# Patient Record
Sex: Female | Born: 1995 | Hispanic: No | Marital: Single | Smoking: Never smoker
Health system: Southern US, Community
[De-identification: ages and names within clinical notes are randomized; demographics above are authoritative.]

## PROBLEM LIST (undated history)

## (undated) DIAGNOSIS — J45909 Unspecified asthma, uncomplicated: Secondary | ICD-10-CM

## (undated) DIAGNOSIS — F32A Depression, unspecified: Secondary | ICD-10-CM

## (undated) DIAGNOSIS — E282 Polycystic ovarian syndrome: Secondary | ICD-10-CM

## (undated) DIAGNOSIS — R0602 Shortness of breath: Secondary | ICD-10-CM

## (undated) DIAGNOSIS — T7840XA Allergy, unspecified, initial encounter: Secondary | ICD-10-CM

## (undated) DIAGNOSIS — L729 Follicular cyst of the skin and subcutaneous tissue, unspecified: Secondary | ICD-10-CM

## (undated) DIAGNOSIS — E669 Obesity, unspecified: Secondary | ICD-10-CM

## (undated) DIAGNOSIS — D649 Anemia, unspecified: Secondary | ICD-10-CM

## (undated) HISTORY — PX: CYST REMOVAL TRUNK: SHX6283

## (undated) HISTORY — DX: Allergy, unspecified, initial encounter: T78.40XA

## (undated) HISTORY — DX: Shortness of breath: R06.02

## (undated) HISTORY — DX: Polycystic ovarian syndrome: E28.2

## (undated) HISTORY — DX: Depression, unspecified: F32.A

## (undated) HISTORY — DX: Unspecified asthma, uncomplicated: J45.909

## (undated) HISTORY — DX: Anemia, unspecified: D64.9

## (undated) HISTORY — DX: Follicular cyst of the skin and subcutaneous tissue, unspecified: L72.9

## (undated) HISTORY — PX: COSMETIC SURGERY: SHX468

---

## 2019-10-25 DIAGNOSIS — F331 Major depressive disorder, recurrent, moderate: Secondary | ICD-10-CM | POA: Diagnosis not present

## 2019-11-14 DIAGNOSIS — L732 Hidradenitis suppurativa: Secondary | ICD-10-CM | POA: Diagnosis not present

## 2019-11-16 DIAGNOSIS — F331 Major depressive disorder, recurrent, moderate: Secondary | ICD-10-CM | POA: Diagnosis not present

## 2019-11-22 DIAGNOSIS — F331 Major depressive disorder, recurrent, moderate: Secondary | ICD-10-CM | POA: Diagnosis not present

## 2019-11-28 DIAGNOSIS — F331 Major depressive disorder, recurrent, moderate: Secondary | ICD-10-CM | POA: Diagnosis not present

## 2019-11-30 DIAGNOSIS — L732 Hidradenitis suppurativa: Secondary | ICD-10-CM | POA: Diagnosis not present

## 2019-12-18 DIAGNOSIS — L03312 Cellulitis of back [any part except buttock]: Secondary | ICD-10-CM | POA: Diagnosis not present

## 2019-12-24 ENCOUNTER — Emergency Department (HOSPITAL_COMMUNITY)
Admission: EM | Admit: 2019-12-24 | Discharge: 2019-12-24 | Disposition: A | Discharge status: Home / Self Care | Payer: BC Managed Care – PPO | Attending: Emergency Medicine | Admitting: Emergency Medicine

## 2019-12-24 ENCOUNTER — Encounter (HOSPITAL_COMMUNITY): Payer: Self-pay | Admitting: Emergency Medicine

## 2019-12-24 ENCOUNTER — Other Ambulatory Visit: Payer: Self-pay

## 2019-12-24 DIAGNOSIS — N611 Abscess of the breast and nipple: Secondary | ICD-10-CM | POA: Diagnosis not present

## 2019-12-24 DIAGNOSIS — Z79899 Other long term (current) drug therapy: Secondary | ICD-10-CM | POA: Insufficient documentation

## 2019-12-24 DIAGNOSIS — N644 Mastodynia: Secondary | ICD-10-CM | POA: Diagnosis not present

## 2019-12-24 DIAGNOSIS — R509 Fever, unspecified: Secondary | ICD-10-CM | POA: Diagnosis not present

## 2019-12-24 MED ORDER — SULFAMETHOXAZOLE-TRIMETHOPRIM 800-160 MG PO TABS: 1.0000 | ORAL_TABLET | Freq: Two times a day (BID) | ORAL | 0 refills | Status: AC

## 2019-12-24 NOTE — ED Provider Notes (Signed)
[place] EMERGENCY DEPARTMENT Provider Note   CSN: [id number] Arrival date & time: 12/24/19  1057     History Chief Complaint  Patient presents with  . Breast Pain    Casey Richmond is a 24 y.o. female who presents to the ED today complaining of gradual onset, constant, worsening, redness/pain to left breast x 3 days.  Patient endorses history of recurrent abscesses.  She reports she had an abscess to her right back 2 weeks ago and was seen at urgent care last week and started on clindamycin.  She reports she was told that she has MRSA at times and also hidradenitis suppurativa is unsure what is actually true.  She states she is here for her abscess on her left breast.  She has about 3 days left of the clindamycin however states that it has not been helping her breast.  She states she has had intermittent fevers with highest 100.7 F 4 to 5 days ago.  She has also been having intermittent chills.  She reports that she has had abscesses to her breast in the past and this feels similar.  She has had them drained in the past.  She states that no one has actually ever cultured the area to see if she is not a MRSA carrier.   The history is provided by the patient and medical records.       History reviewed. No pertinent past medical history.  There are no problems to display for this patient.   History reviewed. No pertinent surgical history.   OB History   No obstetric history on file.     No family history on file.  Social History   Tobacco Use  . Smoking status: Never Smoker  . Smokeless tobacco: Never Used  Substance Use Topics  . Alcohol use: Not Currently  . Drug use: Never    Home Medications Prior to Admission medications   Medication Sig Start Date End Date Taking? Authorizing Provider  sulfamethoxazole-trimethoprim (BACTRIM DS) 800-160 MG tablet Take 1 tablet by mouth 2 (two) times daily for 7 days. 12/24/19 12/31/19  Eustaquio Maize, PA-C      Allergies    Patient has no allergy information on record.  Review of Systems   Review of Systems  Constitutional: Positive for chills and fever.  Skin: Positive for color change.  All other systems reviewed and are negative.   Physical Exam Updated Vital Signs BP 135/67 (BP Location: Left Arm)   Pulse (!) 108   Temp 98.5 F (36.9 C) (Oral)   Resp 18   Ht 5\' 3"  (1.6 m)   Wt (!) 149.7 kg   SpO2 99%   BMI 58.46 kg/m   Physical Exam Vitals and nursing note reviewed.  Constitutional:      Appearance: She is not ill-appearing.  HENT:     Head: Normocephalic and atraumatic.  Eyes:     Conjunctiva/sclera: Conjunctivae normal.  Cardiovascular:     Rate and Rhythm: Normal rate and regular rhythm.     Pulses: Normal pulses.  Pulmonary:     Effort: Pulmonary effort is normal.     Breath sounds: Normal breath sounds. No wheezing, rhonchi or rales.  Chest:     Comments: Chaperone present for exam. Large area of erythema and induration noted to left breast with TTP.  Abdominal:     Palpations: Abdomen is soft.     Tenderness: There is no abdominal tenderness.  Musculoskeletal:  Cervical back: Neck supple.  Skin:    General: Skin is warm and dry.  Neurological:     Mental Status: She is alert.     ED Results / Procedures / Treatments   Labs (all labs ordered are listed, but only abnormal results are displayed) Labs Reviewed - No data to display  EKG None  Radiology No results found.  Procedures Ultrasound ED Soft Tissue  Date/Time: 12/24/2019 12:21 PM Performed by: Tanda Rockers, PA-C Authorized by: Tanda Rockers, PA-C   Procedure details:    Indications: localization of abscess     Transverse view:  Visualized   Longitudinal view:  Visualized Location:    Location: chest     Side:  Left Findings:     no abscess present   (including critical care time)  Medications Ordered in ED Medications - No data to display  ED Course  I have  reviewed the triage vital signs and the nursing notes.  Pertinent labs & imaging results that were available during my care of the patient were reviewed by me and considered in my medical decision making (see chart for details).    MDM Rules/Calculators/A&P                      24 year old female who presents to the ED today complaining of possible abscess to left breast.  Reports pain and swelling and redness to left breast for the past couple of days.  History of possible recurrent abscesses.  States that individuals have told her she has MRSA without culturing it and others have told her she has hidradenitis suppurativa.  Currently on clindamycin for an abscess to her back that has been improving however states that the left breast abscess popped up while on the clindamycin and she has been spiking fevers.  On arrival to the ED patient is afebrile, tachycardic in the 100s, nontachypneic. While in room pt's heart rate has decreased. She appears to be in no acute distress and is nontoxic appearing.   Ultrasound at bedside does not show any obvious drainable abscess today - given it does not appear clindamycin is appropriately controlling this will change to Bactrim at this time.  Will place referral to breast center.  Patient advised on warm compresses for the next 48 hours and to return to either the ED for recheck or if breast center can see her in the next 2 to 3 days to follow-up with them.  Patient is in agreement with plan and stable for discharge home.   This note was prepared using Dragon voice recognition software and may include unintentional dictation errors due to the inherent limitations of voice recognition software.  Final Clinical Impression(s) / ED Diagnoses Final diagnoses:  Breast abscess    Rx / DC Orders ED Discharge Orders         Ordered    Ambulatory referral to Breast Clinic     12/24/19 1225    sulfamethoxazole-trimethoprim (BACTRIM DS) 800-160 MG tablet  2 times  daily     12/24/19 1227           Discharge Instructions     Please pick up antibiotic and take as prescribed. Discontinue the clindamycin.   Apply warm compresses to your breast to allow for softening of the abscess. I have placed an ambulatory referral to the breast center - they should call you to schedule an appointment. If they are unable to see you either Monday or Tuesday for recheck  please return to the ED at that time.   Take 600 mg Ibuprofen and 1,000 mg Tylenol as needed for pain.        Tanda Rockers, PA-C 12/24/19 1228    Arby Barrette, MD 01/03/20 1527

## 2019-12-24 NOTE — Discharge Instructions (Addendum)
Please pick up antibiotic and take as prescribed. Discontinue the clindamycin.   Apply warm compresses to your breast to allow for softening of the abscess. I have placed an ambulatory referral to the breast center - they should call you to schedule an appointment. If they are unable to see you either Monday or Tuesday for recheck please return to the ED at that time.   Take 600 mg Ibuprofen and 1,000 mg Tylenol as needed for pain.

## 2019-12-24 NOTE — ED Triage Notes (Signed)
Pt seen at [facility] last weekend for abscess on back and taking Clindamycin.  Reports abscess to L breast since Tuesday with intermittent fever.

## 2020-03-16 DIAGNOSIS — F331 Major depressive disorder, recurrent, moderate: Secondary | ICD-10-CM | POA: Diagnosis not present

## 2020-04-11 DIAGNOSIS — J45909 Unspecified asthma, uncomplicated: Secondary | ICD-10-CM | POA: Diagnosis not present

## 2020-04-11 DIAGNOSIS — N926 Irregular menstruation, unspecified: Secondary | ICD-10-CM | POA: Diagnosis not present

## 2020-04-11 DIAGNOSIS — J309 Allergic rhinitis, unspecified: Secondary | ICD-10-CM | POA: Diagnosis not present

## 2020-04-11 DIAGNOSIS — R635 Abnormal weight gain: Secondary | ICD-10-CM | POA: Diagnosis not present

## 2020-05-01 ENCOUNTER — Emergency Department (HOSPITAL_COMMUNITY)
Admission: EM | Admit: 2020-05-01 | Discharge: 2020-05-01 | Disposition: A | Discharge status: Home / Self Care | Payer: BC Managed Care – PPO | Attending: Emergency Medicine | Admitting: Emergency Medicine

## 2020-05-01 ENCOUNTER — Encounter (HOSPITAL_COMMUNITY): Payer: Self-pay | Admitting: Emergency Medicine

## 2020-05-01 ENCOUNTER — Other Ambulatory Visit: Payer: Self-pay

## 2020-05-01 DIAGNOSIS — R1031 Right lower quadrant pain: Secondary | ICD-10-CM | POA: Diagnosis not present

## 2020-05-01 DIAGNOSIS — Z5321 Procedure and treatment not carried out due to patient leaving prior to being seen by health care provider: Secondary | ICD-10-CM | POA: Diagnosis not present

## 2020-05-01 DIAGNOSIS — R1032 Left lower quadrant pain: Secondary | ICD-10-CM | POA: Diagnosis not present

## 2020-05-01 DIAGNOSIS — R109 Unspecified abdominal pain: Secondary | ICD-10-CM | POA: Diagnosis present

## 2020-05-01 HISTORY — DX: Obesity, unspecified: E66.9

## 2020-05-01 LAB — COMPREHENSIVE METABOLIC PANEL
ALT: 13 U/L (ref 0–44)
AST: 14 U/L — ABNORMAL LOW (ref 15–41)
Albumin: 3.9 g/dL (ref 3.5–5.0)
Alkaline Phosphatase: 43 U/L (ref 38–126)
Anion gap: 9 (ref 5–15)
BUN: 12 mg/dL (ref 6–20)
CO2: 25 mmol/L (ref 22–32)
Calcium: 8.8 mg/dL — ABNORMAL LOW (ref 8.9–10.3)
Chloride: 105 mmol/L (ref 98–111)
Creatinine, Ser: 0.76 mg/dL (ref 0.44–1.00)
GFR calc Af Amer: >60 mL/min (ref >60)
GFR calc non Af Amer: >60 mL/min (ref >60)
Glucose, Bld: 135 mg/dL — ABNORMAL HIGH (ref 70–99)
Potassium: 3.7 mmol/L (ref 3.5–5.1)
Sodium: 139 mmol/L (ref 135–145)
Total Bilirubin: 0.3 mg/dL (ref 0.3–1.2)
Total Protein: 8 g/dL (ref 6.5–8.1)

## 2020-05-01 LAB — CBC
HCT: 29.7 % — ABNORMAL LOW (ref 36.0–46.0)
Hemoglobin: 7.8 g/dL — ABNORMAL LOW (ref 12.0–15.0)
MCH: 18.3 pg — ABNORMAL LOW (ref 26.0–34.0)
MCHC: 26.3 g/dL — ABNORMAL LOW (ref 30.0–36.0)
MCV: 69.7 fL — ABNORMAL LOW (ref 80.0–100.0)
Platelets: 430 10*3/uL — ABNORMAL HIGH (ref 150–400)
RBC: 4.26 MIL/uL (ref 3.87–5.11)
RDW: 18.1 % — ABNORMAL HIGH (ref 11.5–15.5)
WBC: 12.7 10*3/uL — ABNORMAL HIGH (ref 4.0–10.5)
nRBC: 0 % (ref 0.0–0.2)

## 2020-05-01 LAB — I-STAT BETA HCG BLOOD, ED (MC, WL, AP ONLY): I-stat hCG, quantitative: <5 m[IU]/mL (ref <5)

## 2020-05-01 LAB — LIPASE, BLOOD: Lipase: 25 U/L (ref 11–51)

## 2020-05-01 NOTE — ED Triage Notes (Signed)
Sent by UC for ct-scan of her abdomen, R/O appendicitis or kidney stones. C/C abd pain x3 days, RLQ and LLQ. Denies dysuria.

## 2020-05-02 ENCOUNTER — Encounter (HOSPITAL_COMMUNITY): Payer: Self-pay | Admitting: *Deleted

## 2020-05-02 ENCOUNTER — Emergency Department (HOSPITAL_COMMUNITY): Payer: BC Managed Care – PPO

## 2020-05-02 ENCOUNTER — Emergency Department (HOSPITAL_COMMUNITY)
Admission: EM | Admit: 2020-05-02 | Discharge: 2020-05-03 | Disposition: A | Discharge status: Home / Self Care | Payer: BC Managed Care – PPO | Attending: Emergency Medicine | Admitting: Emergency Medicine

## 2020-05-02 DIAGNOSIS — R110 Nausea: Secondary | ICD-10-CM | POA: Diagnosis not present

## 2020-05-02 DIAGNOSIS — R0602 Shortness of breath: Secondary | ICD-10-CM

## 2020-05-02 DIAGNOSIS — R319 Hematuria, unspecified: Secondary | ICD-10-CM | POA: Diagnosis not present

## 2020-05-02 DIAGNOSIS — R1031 Right lower quadrant pain: Secondary | ICD-10-CM | POA: Insufficient documentation

## 2020-05-02 DIAGNOSIS — R109 Unspecified abdominal pain: Secondary | ICD-10-CM | POA: Diagnosis not present

## 2020-05-02 DIAGNOSIS — R509 Fever, unspecified: Secondary | ICD-10-CM | POA: Diagnosis not present

## 2020-05-02 DIAGNOSIS — M545 Low back pain: Secondary | ICD-10-CM | POA: Diagnosis not present

## 2020-05-02 LAB — COMPREHENSIVE METABOLIC PANEL
ALT: 14 U/L (ref 0–44)
AST: 13 U/L — ABNORMAL LOW (ref 15–41)
Albumin: 3.5 g/dL (ref 3.5–5.0)
Alkaline Phosphatase: 40 U/L (ref 38–126)
Anion gap: 12 (ref 5–15)
BUN: 10 mg/dL (ref 6–20)
CO2: 24 mmol/L (ref 22–32)
Calcium: 8.9 mg/dL (ref 8.9–10.3)
Chloride: 101 mmol/L (ref 98–111)
Creatinine, Ser: 0.69 mg/dL (ref 0.44–1.00)
GFR calc Af Amer: >60 mL/min (ref >60)
GFR calc non Af Amer: >60 mL/min (ref >60)
Glucose, Bld: 111 mg/dL — ABNORMAL HIGH (ref 70–99)
Potassium: 3.6 mmol/L (ref 3.5–5.1)
Sodium: 137 mmol/L (ref 135–145)
Total Bilirubin: 0.4 mg/dL (ref 0.3–1.2)
Total Protein: 7.6 g/dL (ref 6.5–8.1)

## 2020-05-02 LAB — CBC
HCT: 29.3 % — ABNORMAL LOW (ref 36.0–46.0)
Hemoglobin: 7.5 g/dL — ABNORMAL LOW (ref 12.0–15.0)
MCH: 17.8 pg — ABNORMAL LOW (ref 26.0–34.0)
MCHC: 25.6 g/dL — ABNORMAL LOW (ref 30.0–36.0)
MCV: 69.4 fL — ABNORMAL LOW (ref 80.0–100.0)
Platelets: 396 10*3/uL (ref 150–400)
RBC: 4.22 MIL/uL (ref 3.87–5.11)
RDW: 17.7 % — ABNORMAL HIGH (ref 11.5–15.5)
WBC: 9.5 10*3/uL (ref 4.0–10.5)
nRBC: 0 % (ref 0.0–0.2)

## 2020-05-02 LAB — URINALYSIS, ROUTINE W REFLEX MICROSCOPIC

## 2020-05-02 LAB — I-STAT BETA HCG BLOOD, ED (MC, WL, AP ONLY): I-stat hCG, quantitative: <5 m[IU]/mL (ref <5)

## 2020-05-02 LAB — URINALYSIS, MICROSCOPIC (REFLEX)

## 2020-05-02 LAB — LIPASE, BLOOD: Lipase: 27 U/L (ref 11–51)

## 2020-05-02 MED ORDER — ONDANSETRON 4 MG PO TBDP
8.0000 mg | ORAL_TABLET | Freq: Once | ORAL | Status: AC
  Administered 2020-05-02: 8 mg via ORAL
  Filled 2020-05-02: qty 2

## 2020-05-02 MED ORDER — OXYCODONE-ACETAMINOPHEN 5-325 MG PO TABS
2.0000 | ORAL_TABLET | Freq: Once | ORAL | Status: AC
  Administered 2020-05-02: 2 via ORAL
  Filled 2020-05-02: qty 2

## 2020-05-02 NOTE — ED Notes (Signed)
Tried to call patient for room but no response and not seen for almost 2 hours.

## 2020-05-02 NOTE — ED Triage Notes (Signed)
To ED for eval of right lower abd pain, groin pain, and right flank pain. To ED for same yesterday but LWBS due to wait time. States she is now having hematuria

## 2020-05-02 NOTE — Progress Notes (Signed)
CT awaiting IV placement for with contrast scan

## 2020-05-02 NOTE — ED Provider Notes (Addendum)
Patient placed in Quick Look pathway, seen and evaluated   Chief Complaint: Abdominal pain   HPI:   4 days of the right lower back, right lower quadrant, and pelvis.  Patient endorses fever, bloody clots in her urine.  Went to urgent care, concern for blood clots or ruptured appendix.  Endorses nausea, no vomiting. Also endorses SOB that started today.  ROS: Abdominal pain, right flank pain, right pelvic pain  Physical Exam:   Gen: Uncomfortable appearing, nontoxic   Neuro: Awake and Alert  Skin: Warm    Focused Exam: RLQ pain, generalized tenderness. Soft. No flank pain   Initiation of care has begun. The patient has been counseled on the process, plan, and necessity for staying for the completion/evaluation, and the remainder of the medical screening examination    Mare Ferrari, PA-C 05/02/20 1434    Mare Ferrari, PA-C 05/02/20 1437    Sabas Sous, MD 05/02/20 1523

## 2020-05-04 ENCOUNTER — Other Ambulatory Visit: Payer: Self-pay

## 2020-05-04 ENCOUNTER — Emergency Department (HOSPITAL_BASED_OUTPATIENT_CLINIC_OR_DEPARTMENT_OTHER)
Admission: EM | Admit: 2020-05-04 | Discharge: 2020-05-05 | Disposition: A | Discharge status: Home / Self Care | Payer: BC Managed Care – PPO | Attending: Emergency Medicine | Admitting: Emergency Medicine

## 2020-05-04 ENCOUNTER — Encounter (HOSPITAL_BASED_OUTPATIENT_CLINIC_OR_DEPARTMENT_OTHER): Payer: Self-pay | Admitting: Emergency Medicine

## 2020-05-04 ENCOUNTER — Emergency Department (HOSPITAL_BASED_OUTPATIENT_CLINIC_OR_DEPARTMENT_OTHER): Payer: BC Managed Care – PPO

## 2020-05-04 DIAGNOSIS — N739 Female pelvic inflammatory disease, unspecified: Secondary | ICD-10-CM

## 2020-05-04 DIAGNOSIS — N939 Abnormal uterine and vaginal bleeding, unspecified: Secondary | ICD-10-CM | POA: Insufficient documentation

## 2020-05-04 DIAGNOSIS — K5900 Constipation, unspecified: Secondary | ICD-10-CM | POA: Insufficient documentation

## 2020-05-04 DIAGNOSIS — R109 Unspecified abdominal pain: Secondary | ICD-10-CM | POA: Diagnosis not present

## 2020-05-04 DIAGNOSIS — N921 Excessive and frequent menstruation with irregular cycle: Secondary | ICD-10-CM | POA: Diagnosis not present

## 2020-05-04 DIAGNOSIS — D649 Anemia, unspecified: Secondary | ICD-10-CM | POA: Insufficient documentation

## 2020-05-04 DIAGNOSIS — R102 Pelvic and perineal pain: Secondary | ICD-10-CM | POA: Insufficient documentation

## 2020-05-04 DIAGNOSIS — R509 Fever, unspecified: Secondary | ICD-10-CM | POA: Diagnosis not present

## 2020-05-04 DIAGNOSIS — N920 Excessive and frequent menstruation with regular cycle: Secondary | ICD-10-CM | POA: Diagnosis not present

## 2020-05-04 DIAGNOSIS — M545 Low back pain: Secondary | ICD-10-CM | POA: Insufficient documentation

## 2020-05-04 DIAGNOSIS — Z20822 Contact with and (suspected) exposure to covid-19: Secondary | ICD-10-CM | POA: Diagnosis not present

## 2020-05-04 DIAGNOSIS — R1031 Right lower quadrant pain: Secondary | ICD-10-CM | POA: Diagnosis not present

## 2020-05-04 DIAGNOSIS — K760 Fatty (change of) liver, not elsewhere classified: Secondary | ICD-10-CM | POA: Diagnosis not present

## 2020-05-04 LAB — CBC
HCT: 28.4 % — ABNORMAL LOW (ref 36.0–46.0)
Hemoglobin: 7.5 g/dL — ABNORMAL LOW (ref 12.0–15.0)
MCH: 18.1 pg — ABNORMAL LOW (ref 26.0–34.0)
MCHC: 26.4 g/dL — ABNORMAL LOW (ref 30.0–36.0)
MCV: 68.4 fL — ABNORMAL LOW (ref 80.0–100.0)
Platelets: 405 10*3/uL — ABNORMAL HIGH (ref 150–400)
RBC: 4.15 MIL/uL (ref 3.87–5.11)
RDW: 17.5 % — ABNORMAL HIGH (ref 11.5–15.5)
WBC: 11.6 10*3/uL — ABNORMAL HIGH (ref 4.0–10.5)
nRBC: 0 % (ref 0.0–0.2)

## 2020-05-04 LAB — COMPREHENSIVE METABOLIC PANEL
ALT: 13 U/L (ref 0–44)
AST: 16 U/L (ref 15–41)
Albumin: 4 g/dL (ref 3.5–5.0)
Alkaline Phosphatase: 44 U/L (ref 38–126)
Anion gap: 10 (ref 5–15)
BUN: 13 mg/dL (ref 6–20)
CO2: 25 mmol/L (ref 22–32)
Calcium: 8.9 mg/dL (ref 8.9–10.3)
Chloride: 102 mmol/L (ref 98–111)
Creatinine, Ser: 0.6 mg/dL (ref 0.44–1.00)
GFR calc Af Amer: >60 mL/min (ref >60)
GFR calc non Af Amer: >60 mL/min (ref >60)
Glucose, Bld: 129 mg/dL — ABNORMAL HIGH (ref 70–99)
Potassium: 3.8 mmol/L (ref 3.5–5.1)
Sodium: 137 mmol/L (ref 135–145)
Total Bilirubin: 0.2 mg/dL — ABNORMAL LOW (ref 0.3–1.2)
Total Protein: 8.2 g/dL — ABNORMAL HIGH (ref 6.5–8.1)

## 2020-05-04 LAB — URINALYSIS, ROUTINE W REFLEX MICROSCOPIC

## 2020-05-04 LAB — URINALYSIS, MICROSCOPIC (REFLEX): RBC / HPF: >50 RBC/hpf (ref 0–5)

## 2020-05-04 LAB — PREGNANCY, URINE: Preg Test, Ur: NEGATIVE

## 2020-05-04 LAB — LIPASE, BLOOD: Lipase: 28 U/L (ref 11–51)

## 2020-05-04 MED ORDER — IOHEXOL 300 MG/ML  SOLN
100.0000 mL | Freq: Once | INTRAMUSCULAR | Status: AC | PRN
  Administered 2020-05-04: 100 mL via INTRAVENOUS

## 2020-05-04 MED ORDER — KETOROLAC TROMETHAMINE 30 MG/ML IJ SOLN
30.0000 mg | Freq: Once | INTRAMUSCULAR | Status: AC
  Administered 2020-05-04: 30 mg via INTRAVENOUS
  Filled 2020-05-04: qty 1

## 2020-05-04 MED ORDER — SODIUM CHLORIDE 0.9 % IV BOLUS
1000.0000 mL | Freq: Once | INTRAVENOUS | Status: AC
  Administered 2020-05-04: 1000 mL via INTRAVENOUS

## 2020-05-04 MED ORDER — MORPHINE SULFATE (PF) 4 MG/ML IV SOLN
4.0000 mg | Freq: Once | INTRAVENOUS | Status: AC
  Administered 2020-05-04: 4 mg via INTRAVENOUS
  Filled 2020-05-04: qty 1

## 2020-05-04 MED ORDER — ONDANSETRON HCL 4 MG/2ML IJ SOLN
4.0000 mg | Freq: Once | INTRAMUSCULAR | Status: AC
  Administered 2020-05-04: 4 mg via INTRAVENOUS
  Filled 2020-05-04: qty 2

## 2020-05-04 NOTE — ED Notes (Signed)
States feeling much better, resting more quietly at this time, less restless on stretcher, significant other at bedside

## 2020-05-04 NOTE — ED Provider Notes (Signed)
[facility] EMERGENCY DEPARTMENT Provider Note   CSN: [id number] Arrival date & time: 05/04/20  1734     History Chief Complaint  Patient presents with  . Abdominal Pain    Casey Richmond is a 24 y.o. female.  HPI      5th day of abdomen and lower back and groin pain  Comes in waves of pain. Better laying down. Moving or walking makes it worse.  As laying now it is still severe 9/10 pain. Located lower abdomen bilaterally with radiation to the back bilaterally Nausea, low grade fever No urinary symptoms with exception of frequency Didn't have BM for a few days, had one today    No hx of kidney stones  Hx of anemia but unknown baseline hgb  Menses for over one week, started a new birth control, hx of heavy and infrequent periods, changing pads every 1-2 hr    Past Medical History:  Diagnosis Date  . Obesity     There are no problems to display for this patient.   History reviewed. No pertinent surgical history.   OB History   No obstetric history on file.     No family history on file.  Social History   Tobacco Use  . Smoking status: Never Smoker  . Smokeless tobacco: Never Used  Substance Use Topics  . Alcohol use: Not Currently  . Drug use: Never    Home Medications Prior to Admission medications   Medication Sig Start Date End Date Taking? Authorizing Provider  doxycycline (VIBRAMYCIN) 100 MG capsule Take 1 capsule (100 mg total) by mouth 2 (two) times daily for 14 days. 05/05/20 05/19/20  Alvira MondaySchlossman, Haniah Penny, MD  ferrous sulfate 325 (65 FE) MG tablet Take 1 tablet (325 mg total) by mouth daily. 05/05/20   Alvira MondaySchlossman, Nikki Glanzer, MD  metroNIDAZOLE (FLAGYL) 500 MG tablet Take 1 tablet (500 mg total) by mouth 2 (two) times daily for 14 days. 05/05/20 05/19/20  Alvira MondaySchlossman, Austin Pongratz, MD  naproxen (NAPROSYN) 500 MG tablet Take 1 tablet (500 mg total) by mouth 2 (two) times daily. 05/05/20   Alvira MondaySchlossman, Jaxsyn Catalfamo, MD    Allergies    Patient has no known  allergies.  Review of Systems   Review of Systems  Constitutional: Positive for appetite change and fever.  HENT: Negative for congestion.   Respiratory: Negative for cough and shortness of breath.   Cardiovascular: Negative for chest pain.  Gastrointestinal: Positive for abdominal pain, constipation, nausea and vomiting (a few episodes). Negative for diarrhea.  Genitourinary: Positive for flank pain, frequency, menstrual problem and vaginal bleeding. Negative for vaginal discharge.  Musculoskeletal: Positive for back pain.  Skin: Negative for rash.  Neurological: Negative for light-headedness and headaches.    Physical Exam Updated Vital Signs BP 136/84 (BP Location: Right Arm)   Pulse (!) 109   Temp 99.3 F (37.4 C) (Oral)   Resp 19   SpO2 100%   Physical Exam Vitals and nursing note reviewed.  Constitutional:      General: She is not in acute distress.    Appearance: Normal appearance. She is not ill-appearing, toxic-appearing or diaphoretic.  HENT:     Head: Normocephalic.  Eyes:     Conjunctiva/sclera: Conjunctivae normal.  Cardiovascular:     Rate and Rhythm: Normal rate and regular rhythm.     Pulses: Normal pulses.  Pulmonary:     Effort: Pulmonary effort is normal. No respiratory distress.  Abdominal:     Tenderness: There is abdominal tenderness in  the right lower quadrant, suprapubic area and left lower quadrant. There is no right CVA tenderness or left CVA tenderness.  Genitourinary:    Cervix: Cervical motion tenderness and cervical bleeding present. No discharge.     Uterus: Tender.      Adnexa:        Right: No tenderness.         Left: No tenderness.    Musculoskeletal:        General: No deformity or signs of injury.     Cervical back: No rigidity.  Skin:    General: Skin is warm and dry.     Coloration: Skin is not jaundiced or pale.  Neurological:     General: No focal deficit present.     Mental Status: She is alert and oriented to person,  place, and time.     ED Results / Procedures / Treatments   Labs (all labs ordered are listed, but only abnormal results are displayed) Labs Reviewed  WET PREP, GENITAL - Abnormal; Notable for the following components:      Result Value   WBC, Wet Prep HPF POC FEW (*)    All other components within normal limits  COMPREHENSIVE METABOLIC PANEL - Abnormal; Notable for the following components:   Glucose, Bld 129 (*)    Total Protein 8.2 (*)    Total Bilirubin 0.2 (*)    All other components within normal limits  CBC - Abnormal; Notable for the following components:   WBC 11.6 (*)    Hemoglobin 7.5 (*)    HCT 28.4 (*)    MCV 68.4 (*)    MCH 18.1 (*)    MCHC 26.4 (*)    RDW 17.5 (*)    Platelets 405 (*)    All other components within normal limits  URINALYSIS, ROUTINE W REFLEX MICROSCOPIC - Abnormal; Notable for the following components:   Color, Urine RED (*)    APPearance TURBID (*)    Glucose, UA   (*)    Value: TEST NOT REPORTED DUE TO COLOR INTERFERENCE OF URINE PIGMENT   Hgb urine dipstick   (*)    Value: TEST NOT REPORTED DUE TO COLOR INTERFERENCE OF URINE PIGMENT   Bilirubin Urine   (*)    Value: TEST NOT REPORTED DUE TO COLOR INTERFERENCE OF URINE PIGMENT   Ketones, ur   (*)    Value: TEST NOT REPORTED DUE TO COLOR INTERFERENCE OF URINE PIGMENT   Protein, ur   (*)    Value: TEST NOT REPORTED DUE TO COLOR INTERFERENCE OF URINE PIGMENT   Nitrite   (*)    Value: TEST NOT REPORTED DUE TO COLOR INTERFERENCE OF URINE PIGMENT   Leukocytes,Ua   (*)    Value: TEST NOT REPORTED DUE TO COLOR INTERFERENCE OF URINE PIGMENT   All other components within normal limits  URINALYSIS, MICROSCOPIC (REFLEX) - Abnormal; Notable for the following components:   Bacteria, UA MANY (*)    All other components within normal limits  SARS CORONAVIRUS 2 BY RT PCR (HOSPITAL ORDER, PERFORMED IN [facility])  URINE CULTURE  LIPASE, BLOOD  PREGNANCY, URINE  GC/CHLAMYDIA PROBE AMP  ([place]) NOT AT [facility]    EKG None  Radiology CT ABDOMEN PELVIS W CONTRAST  Result Date: 05/04/2020 CLINICAL DATA:  Right lower quadrant abdominal pain EXAM: CT ABDOMEN AND PELVIS WITH CONTRAST TECHNIQUE: Multidetector CT imaging of the abdomen and pelvis was performed using the standard protocol following bolus administration  of intravenous contrast. CONTRAST:  OMNIPAQUE IOHEXOL 300 MG/ML  SOLN COMPARISON:  None. FINDINGS: Lower chest: Lung bases are clear. No effusions. Heart is normal size. Hepatobiliary: Diffuse low-density throughout the liver compatible with fatty infiltration. No focal abnormality. Gallbladder unremarkable. Pancreas: No focal abnormality or ductal dilatation. Spleen: No focal abnormality.  Normal size. Adrenals/Urinary Tract: No adrenal abnormality. No focal renal abnormality. No stones or hydronephrosis. Urinary bladder is unremarkable. Stomach/Bowel: Normal appendix. Stomach, large and small bowel grossly unremarkable. Vascular/Lymphatic: No evidence of aneurysm or adenopathy. Reproductive: Uterus and adnexa unremarkable.  No mass. Other: No free fluid or free air. Musculoskeletal: No acute bony abnormality. IMPRESSION: Normal appendix. Diffuse fatty infiltration of the liver. No acute findings. Electronically Signed   By: Charlett Nose M.D.   On: 05/04/2020 23:27    Procedures Procedures (including critical care time)  Medications Ordered in ED Medications  sodium chloride 0.9 % bolus 1,000 mL (0 mLs Intravenous Stopped 05/04/20 2339)  ondansetron (ZOFRAN) injection 4 mg (4 mg Intravenous Given 05/04/20 2301)  morphine 4 MG/ML injection 4 mg (4 mg Intravenous Given 05/04/20 2301)  iohexol (OMNIPAQUE) 300 MG/ML solution 100 mL (100 mLs Intravenous Contrast Given 05/04/20 2314)  ketorolac (TORADOL) 30 MG/ML injection 30 mg (30 mg Intravenous Given 05/04/20 2354)  cefTRIAXone (ROCEPHIN) injection 500 mg (500 mg Intramuscular Given 05/05/20 0054)  azithromycin  (ZITHROMAX) powder 1 g (1 g Oral Given 05/05/20 0053)  acetaminophen (TYLENOL) tablet 1,000 mg (1,000 mg Oral Given 05/05/20 0052)  lidocaine (PF) (XYLOCAINE) 1 % injection (1.2 mLs  Given 05/05/20 0054)    ED Course  I have reviewed the triage vital signs and the nursing notes.  Pertinent labs & imaging results that were available during my care of the patient were reviewed by me and considered in my medical decision making (see chart for details).    MDM Rules/Calculators/A&P                          24yo female with history of anemia (unknown baseline hgb) presents with concern for lower abdominal and back pain for 5 days and menorrhagia.   CT abdomen pelvis does not show signs of appendicitis, diverticulitis,  Pelvic or other abnormalities.  History is not clear for PID however given uterine tenderness on exam will treat for this possibility with rocephin, 2 wk of abx. Will send urine culture for possible UTI.    Given naproxen, flagyl, doxycycline and iron rx.  Labs significant for anemia with hgb of 7.5 which has been present for last 3 days with unknown prior. Given stability last few days and current hgb do not see indication for emergent transfusion, however discussed that if her levels decrease or if she becomes symptomatic she should return to ED. Anemia likely secondary to menorrhagia.  Recently began new OCP for this.  Recommend follow up with PCP and OBGYN and discussed reasons to return.     Final Clinical Impression(s) / ED Diagnoses Final diagnoses:  Anemia, unspecified type  Menorrhagia with irregular cycle  Abdominal pain, unspecified abdominal location  Pelvic pain  Pelvic inflammatory disease    Rx / DC Orders ED Discharge Orders         Ordered    naproxen (NAPROSYN) 500 MG tablet  2 times daily     Discontinue  Reprint     05/05/20 0030    doxycycline (VIBRAMYCIN) 100 MG capsule  2 times daily     Discontinue  Reprint     05/05/20 0030    metroNIDAZOLE  (FLAGYL) 500 MG tablet  2 times daily     Discontinue  Reprint     05/05/20 0030    ferrous sulfate 325 (65 FE) MG tablet  Daily     Discontinue  Reprint     05/05/20 0030           Alvira Monday, MD 05/05/20 1018

## 2020-05-04 NOTE — ED Notes (Signed)
Rec Covid Vaccines , last dose, or 2nd dose was rec May 2021

## 2020-05-04 NOTE — ED Notes (Signed)
States has abd pain and back pain for the past 5 days, was seen by her primary care md and was instructed to come to the ED. Appetite is poor, has had some nausea and vomiting, last vomiting episode was yesterday.

## 2020-05-04 NOTE — ED Triage Notes (Signed)
Abdominal pain for 5 days. Went to [facility] last 2 days but left. States  Her MD called here for her to be seen. NP from [facility]. Denies n/v/d.

## 2020-05-05 ENCOUNTER — Telehealth (HOSPITAL_BASED_OUTPATIENT_CLINIC_OR_DEPARTMENT_OTHER): Payer: Self-pay | Admitting: Emergency Medicine

## 2020-05-05 LAB — WET PREP, GENITAL
Clue Cells Wet Prep HPF POC: NONE SEEN
Sperm: NONE SEEN
Trich, Wet Prep: NONE SEEN
Yeast Wet Prep HPF POC: NONE SEEN

## 2020-05-05 LAB — SARS CORONAVIRUS 2 BY RT PCR (HOSPITAL ORDER, PERFORMED IN ~~LOC~~ HOSPITAL LAB): SARS Coronavirus 2: NEGATIVE

## 2020-05-05 MED ORDER — AZITHROMYCIN 1 G PO PACK
1.0000 g | PACK | Freq: Once | ORAL | Status: AC
  Administered 2020-05-05: 1 g via ORAL
  Filled 2020-05-05: qty 1

## 2020-05-05 MED ORDER — ACETAMINOPHEN 500 MG PO TABS
1000.0000 mg | ORAL_TABLET | Freq: Once | ORAL | Status: AC
  Administered 2020-05-05: 1000 mg via ORAL
  Filled 2020-05-05: qty 2

## 2020-05-05 MED ORDER — DOXYCYCLINE HYCLATE 100 MG PO CAPS: 100.0000 mg | ORAL_CAPSULE | Freq: Two times a day (BID) | ORAL | 0 refills | Status: AC

## 2020-05-05 MED ORDER — METRONIDAZOLE 500 MG PO TABS
500.0000 mg | ORAL_TABLET | Freq: Two times a day (BID) | ORAL | 0 refills | Status: AC
Start: 2020-05-05 — End: 2020-05-19

## 2020-05-05 MED ORDER — FERROUS SULFATE 325 (65 FE) MG PO TABS
325.0000 mg | ORAL_TABLET | Freq: Every day | ORAL | 0 refills | Status: DC
Start: 2020-05-05 — End: 2020-05-21

## 2020-05-05 MED ORDER — NAPROXEN 500 MG PO TABS
500.0000 mg | ORAL_TABLET | Freq: Two times a day (BID) | ORAL | 0 refills | Status: DC
Start: 2020-05-05 — End: 2020-05-21

## 2020-05-05 MED ORDER — LIDOCAINE HCL (PF) 1 % IJ SOLN
INTRAMUSCULAR | Status: AC
  Administered 2020-05-05: 1.2 mL
  Filled 2020-05-05: qty 5

## 2020-05-05 MED ORDER — CEFTRIAXONE SODIUM 500 MG IJ SOLR
500.0000 mg | Freq: Once | INTRAMUSCULAR | Status: AC
  Administered 2020-05-05: 500 mg via INTRAMUSCULAR
  Filled 2020-05-05: qty 500

## 2020-05-05 NOTE — ED Notes (Signed)
AVS and Rx x 4 reviewed with pt and significant other, opportunity for questions provided. Also provided information on when the need may arise to return to the ED. Pt states she is feeling much better and wishes to ambulate to exit, noted to have a steady gait. Significant other is here and is driving pt home.

## 2020-05-05 NOTE — Discharge Instructions (Signed)
You have been seen today for pelvic/abdominal pain and found to have anemia with a hemoglobin of 7.5. Your CT does not show emergent findings. Your pain is likely secondary to pelvic etiology (I.e. endometriosis) however we do not see emergent pelvic pathology. We have discussed covering for pelvic inflammatory disease given pain on exam and also sending urine culture for possible urinary tract infection.  We recommend you follow up with your PCP for recheck blood levels (CBC), OBGYN regarding your heavy menses and pain, and return to the ED if you develop worsening symptoms, lightheadedness or passing out.

## 2020-05-06 LAB — URINE CULTURE

## 2020-05-07 LAB — GC/CHLAMYDIA PROBE AMP (~~LOC~~) NOT AT ARMC
Chlamydia: NEGATIVE
Comment: NEGATIVE
Comment: NORMAL
Neisseria Gonorrhea: NEGATIVE

## 2020-05-21 ENCOUNTER — Encounter: Payer: Self-pay | Admitting: Family Medicine

## 2020-05-21 ENCOUNTER — Ambulatory Visit (INDEPENDENT_AMBULATORY_CARE_PROVIDER_SITE_OTHER): Payer: BC Managed Care – PPO | Admitting: Family Medicine

## 2020-05-21 ENCOUNTER — Other Ambulatory Visit: Payer: Self-pay

## 2020-05-21 VITALS — BP 121/65 | HR 97 | Wt 337.0 lb

## 2020-05-21 DIAGNOSIS — E282 Polycystic ovarian syndrome: Secondary | ICD-10-CM | POA: Diagnosis not present

## 2020-05-21 DIAGNOSIS — Z6841 Body Mass Index (BMI) 40.0 and over, adult: Secondary | ICD-10-CM

## 2020-05-21 DIAGNOSIS — G43709 Chronic migraine without aura, not intractable, without status migrainosus: Secondary | ICD-10-CM | POA: Diagnosis not present

## 2020-05-21 MED ORDER — NORETHIN ACE-ETH ESTRAD-FE 1-20 MG-MCG(24) PO TABS
1.0000 | ORAL_TABLET | Freq: Every day | ORAL | 11 refills | Status: DC
Start: 2020-05-21 — End: 2020-06-07

## 2020-05-21 NOTE — Progress Notes (Signed)
   Subjective:    Patient ID: Casey Richmond, female    DOB: 1995-10-08, 24 y.o.   MRN: [id number]  HPI  Patient seen for irregular periods that started a couple years ago. Previous to this, her periods were regular with 28-30 day interval. The last few years, she would go months without a periods, then would have bleeding for several weeks and up to a month or longer. This past time, she had significant pain with her bleeding. No palliating or provoking factors. Denies hirsutism, female pattern hair loss.   Weight gain since college - approximately 50-75 pounds.  Has history of migraines.  No personal or family history of DVE.   I have reviewed the patients past medical, family, and social history.  I have reviewed the patient's medication list and allergies.   Review of Systems     Objective:   Physical Exam Vitals reviewed.  Constitutional:      Appearance: Normal appearance. She is obese.  HENT:     Head: Normocephalic and atraumatic.  Cardiovascular:     Rate and Rhythm: Normal rate.  Pulmonary:     Effort: Pulmonary effort is normal.  Skin:    General: Skin is warm.     Capillary Refill: Capillary refill takes less than 2 seconds.  Neurological:     Mental Status: She is alert.  Psychiatric:        Mood and Affect: Mood normal.        Thought Content: Thought content normal.        Judgment: Judgment normal.       Assessment & Plan:  1. PCOS (polycystic ovarian syndrome) No outward symptoms of hyperandrogenism. Will check labs. Check TSH and HgA1c. Discussed goals of care - not interested in becoming pregnant in the near future. Weight loss is important in improving outcomes of PCOS, even moderate weight loss. Will refer to [facility]. Additionally, will start hormone control with moderate dose estrogen. - TSH - Hemoglobin A1c - TestT+TestF+SHBG - Follicle stimulating hormone - LH - Ambulatory referral to [facility]  2. Body mass  index (BMI) of 50-59.9 in adult [facility])  - Ambulatory referral to [facility]  3. Chronic migraine without aura without status migrainosus, not intractable Although she does have migraines, will see if able to tolerate moderate dose estrogen without increasing her migraine frequency or severity.

## 2020-05-25 LAB — FOLLICLE STIMULATING HORMONE: FSH: 5.4 m[IU]/mL

## 2020-05-25 LAB — LUTEINIZING HORMONE: LH: 10.2 m[IU]/mL

## 2020-05-25 LAB — HEMOGLOBIN A1C
Est. average glucose Bld gHb Est-mCnc: 108 mg/dL
Hgb A1c MFr Bld: 5.4 % (ref 4.8–5.6)

## 2020-05-25 LAB — TSH: TSH: 1.46 u[IU]/mL (ref 0.450–4.500)

## 2020-05-25 LAB — TESTT+TESTF+SHBG
Sex Hormone Binding: 48.2 nmol/L (ref 24.6–122.0)
Testosterone, Free: 2.1 pg/mL (ref 0.0–4.2)
Testosterone, Total, LC/MS: 54.6 ng/dL (ref 10.0–55.0)

## 2020-06-07 ENCOUNTER — Encounter (INDEPENDENT_AMBULATORY_CARE_PROVIDER_SITE_OTHER): Payer: Self-pay | Admitting: Family Medicine

## 2020-06-07 ENCOUNTER — Ambulatory Visit (INDEPENDENT_AMBULATORY_CARE_PROVIDER_SITE_OTHER): Payer: BC Managed Care – PPO | Admitting: Family Medicine

## 2020-06-07 ENCOUNTER — Other Ambulatory Visit: Payer: Self-pay

## 2020-06-07 VITALS — BP 118/69 | HR 101 | Temp 98.0°F | Ht 63.0 in | Wt 337.0 lb

## 2020-06-07 DIAGNOSIS — R5383 Other fatigue: Secondary | ICD-10-CM

## 2020-06-07 DIAGNOSIS — F5081 Binge eating disorder: Secondary | ICD-10-CM

## 2020-06-07 DIAGNOSIS — Z9189 Other specified personal risk factors, not elsewhere classified: Secondary | ICD-10-CM

## 2020-06-07 DIAGNOSIS — Z1331 Encounter for screening for depression: Secondary | ICD-10-CM | POA: Diagnosis not present

## 2020-06-07 DIAGNOSIS — Z0289 Encounter for other administrative examinations: Secondary | ICD-10-CM

## 2020-06-07 DIAGNOSIS — R739 Hyperglycemia, unspecified: Secondary | ICD-10-CM

## 2020-06-07 DIAGNOSIS — R0602 Shortness of breath: Secondary | ICD-10-CM

## 2020-06-07 DIAGNOSIS — Z6841 Body Mass Index (BMI) 40.0 and over, adult: Secondary | ICD-10-CM

## 2020-06-07 DIAGNOSIS — E282 Polycystic ovarian syndrome: Secondary | ICD-10-CM | POA: Diagnosis not present
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
## 2020-06-08 DIAGNOSIS — F411 Generalized anxiety disorder: Secondary | ICD-10-CM | POA: Diagnosis not present

## 2020-06-08 DIAGNOSIS — F331 Major depressive disorder, recurrent, moderate: Secondary | ICD-10-CM | POA: Diagnosis not present

## 2020-06-08 LAB — CBC WITH DIFFERENTIAL/PLATELET
Basophils Absolute: 0 10*3/uL (ref 0.0–0.2)
Basos: 1 %
EOS (ABSOLUTE): 0.2 10*3/uL (ref 0.0–0.4)
Eos: 2 %
Hematocrit: 31.5 % — ABNORMAL LOW (ref 34.0–46.6)
Hemoglobin: 8.3 g/dL — ABNORMAL LOW (ref 11.1–15.9)
Immature Grans (Abs): 0 10*3/uL (ref 0.0–0.1)
Immature Granulocytes: 0 %
Lymphocytes Absolute: 2.2 10*3/uL (ref 0.7–3.1)
Lymphs: 26 %
MCH: 17.7 pg — ABNORMAL LOW (ref 26.6–33.0)
MCHC: 26.3 g/dL — ABNORMAL LOW (ref 31.5–35.7)
MCV: 67 fL — ABNORMAL LOW (ref 79–97)
Monocytes Absolute: 0.4 10*3/uL (ref 0.1–0.9)
Monocytes: 5 %
Neutrophils Absolute: 5.5 10*3/uL (ref 1.4–7.0)
Neutrophils: 66 %
Platelets: 387 10*3/uL (ref 150–450)
RBC: 4.69 x10E6/uL (ref 3.77–5.28)
RDW: 18.2 % — ABNORMAL HIGH (ref 11.7–15.4)
WBC: 8.4 10*3/uL (ref 3.4–10.8)

## 2020-06-08 LAB — COMPREHENSIVE METABOLIC PANEL
ALT: 12 IU/L (ref 0–32)
AST: 10 IU/L (ref 0–40)
Albumin/Globulin Ratio: 1.2 (ref 1.2–2.2)
Albumin: 4.2 g/dL (ref 3.9–5.0)
Alkaline Phosphatase: 54 IU/L (ref 44–121)
BUN/Creatinine Ratio: 14 (ref 9–23)
BUN: 10 mg/dL (ref 6–20)
Bilirubin Total: <0.2 mg/dL (ref 0.0–1.2)
CO2: 23 mmol/L (ref 20–29)
Calcium: 9 mg/dL (ref 8.7–10.2)
Chloride: 101 mmol/L (ref 96–106)
Creatinine, Ser: 0.71 mg/dL (ref 0.57–1.00)
GFR calc Af Amer: 138 mL/min/{1.73_m2} (ref >59)
GFR calc non Af Amer: 120 mL/min/{1.73_m2} (ref >59)
Globulin, Total: 3.4 g/dL (ref 1.5–4.5)
Glucose: 90 mg/dL (ref 65–99)
Potassium: 4.3 mmol/L (ref 3.5–5.2)
Sodium: 138 mmol/L (ref 134–144)
Total Protein: 7.6 g/dL (ref 6.0–8.5)

## 2020-06-08 LAB — LIPID PANEL
Chol/HDL Ratio: 2.6 ratio (ref 0.0–4.4)
Cholesterol, Total: 142 mg/dL (ref 100–199)
HDL: 55 mg/dL (ref >39)
LDL Chol Calc (NIH): 74 mg/dL (ref 0–99)
Triglycerides: 62 mg/dL (ref 0–149)
VLDL Cholesterol Cal: 13 mg/dL (ref 5–40)

## 2020-06-08 LAB — T3: T3, Total: 187 ng/dL — ABNORMAL HIGH (ref 71–180)

## 2020-06-08 LAB — VITAMIN D 25 HYDROXY (VIT D DEFICIENCY, FRACTURES): Vit D, 25-Hydroxy: 20.9 ng/mL — ABNORMAL LOW (ref 30.0–100.0)

## 2020-06-08 LAB — HEMOGLOBIN A1C
Est. average glucose Bld gHb Est-mCnc: 117 mg/dL
Hgb A1c MFr Bld: 5.7 % — ABNORMAL HIGH (ref 4.8–5.6)

## 2020-06-08 LAB — INSULIN, RANDOM: INSULIN: 92.9 u[IU]/mL — ABNORMAL HIGH (ref 2.6–24.9)

## 2020-06-08 LAB — VITAMIN B12: Vitamin B-12: 589 pg/mL (ref 232–1245)

## 2020-06-08 LAB — FOLATE: Folate: 4.8 ng/mL (ref >3.0)

## 2020-06-08 LAB — T4: T4, Total: 8.4 ug/dL (ref 4.5–12.0)

## 2020-06-08 LAB — TSH: TSH: 1.71 u[IU]/mL (ref 0.450–4.500)

## 2020-06-12 ENCOUNTER — Telehealth (INDEPENDENT_AMBULATORY_CARE_PROVIDER_SITE_OTHER): Payer: BC Managed Care – PPO | Admitting: Psychology

## 2020-06-12 DIAGNOSIS — F331 Major depressive disorder, recurrent, moderate: Secondary | ICD-10-CM | POA: Diagnosis not present

## 2020-06-13 NOTE — Progress Notes (Unsigned)
Office: [phone]  /  Fax: [phone]    Date: June 27, 2020   Appointment Start Time: *** Duration: *** minutes Provider: Lawerance Cruel, Psy.D. Type of Session: Individual Therapy  Location of Patient: {gbptloc:23249} Location of Provider: Provider's Home Type of Contact: Telepsychological Visit via MyChart Video Visit  Session Content: Casey Richmond is a 24 y.o. female presenting for a follow-up appointment to address the previously established treatment goal of increasing coping skills. Today's appointment was a telepsychological visit due to COVID-19. Casey Richmond provided verbal consent for today's telepsychological appointment and she is aware she is responsible for securing confidentiality on her end of the session. Prior to proceeding with today's appointment, Casey Richmond's physical location at the time of this appointment was obtained as well a phone number she could be reached at in the event of technical difficulties. Casey Richmond and this provider participated in today's telepsychological service.   This provider conducted a brief check-in and verbally administered the PHQ-9 and GAD-7. *** Casey Richmond was receptive to today's appointment as evidenced by openness to sharing, responsiveness to feedback, and {gbreceptiveness:23401}.  Mental Status Examination:  Appearance: {Appearance:22431} Behavior: {Behavior:22445} Mood: {gbmood:21757} Affect: {Affect:22436} Speech: {Speech:22432} Eye Contact: {Eye Contact:22433} Psychomotor Activity: {Motor Activity:22434} Gait: {gbgait:23404} Thought Process: {thought process:22448}  Thought Content/Perception: {disturbances:22451} Orientation: {Orientation:22437} Memory/Concentration: {gbcognition:22449} Insight/Judgment: {Insight:22446}  Structured Assessments Results: The Patient Health Questionnaire-9 (PHQ-9) is a self-report measure that assesses symptoms and severity of depression over the course of the last two weeks. Casey Richmond obtained a score of  *** suggesting {GBPHQ9SEVERITY:21752}. Casey Richmond finds the endorsed symptoms to be {gbphq9difficulty:21754}. [0= Not at all; 1= Several days; 2= More than half the days; 3= Nearly every day] Little interest or pleasure in doing things ***  Feeling down, depressed, or hopeless ***  Trouble falling or staying asleep, or sleeping too much ***  Feeling tired or having little energy ***  Poor appetite or overeating ***  Feeling bad about yourself --- or that you are a failure or have let yourself or your family down ***  Trouble concentrating on things, such as reading the newspaper or watching television ***  Moving or speaking so slowly that other people could have noticed? Or the opposite --- being so fidgety or restless that you have been moving around a lot more than usual ***  Thoughts that you would be better off dead or hurting yourself in some way ***  PHQ-9 Score ***    The Generalized Anxiety Disorder-7 (GAD-7) is a brief self-report measure that assesses symptoms of anxiety over the course of the last two weeks. Casey Richmond obtained a score of *** suggesting {gbgad7severity:21753}. Casey Richmond finds the endorsed symptoms to be {gbphq9difficulty:21754}. [0= Not at all; 1= Several days; 2= Over half the days; 3= Nearly every day] Feeling nervous, anxious, on edge ***  Not being able to stop or control worrying ***  Worrying too much about different things ***  Trouble relaxing ***  Being so restless that it's hard to sit still ***  Becoming easily annoyed or irritable ***  Feeling afraid as if something awful might happen ***  GAD-7 Score ***   Interventions:  {Interventions for Progress Notes:23405}  DSM-5 Diagnosis(es): 296.32 (F33.1) Major Depressive Disorder, Recurrent Episode, Moderate, With Anxious Distress  Treatment Goal & Progress: During the initial appointment with this provider, the following treatment goal was established: {gbtxgoals:21759}. Casey Richmond has demonstrated progress in  her goal as evidenced by {gbtxprogress:22839}. Casey Richmond also {gbtxprogress2:22951}.  Plan: The next appointment will be scheduled in {gbweeks:21758}, which will be {  gbtxmodality:23402}. The next session will focus on {Plan for Next Appointment:23400}.

## 2020-06-14 NOTE — Progress Notes (Signed)
Chief Complaint:   OBESITY Casey Richmond (MR# [id number]) is a 24 y.o. female who presents for evaluation and treatment of obesity and related comorbidities. Current BMI is Body mass index is 59.7 kg/m. Casey Richmond has been struggling with her weight for many years and has been unsuccessful in either losing weight, maintaining weight loss, or reaching her healthy weight goal.  Casey Richmond is interested about learning more about weight loss surgery.   Casey Richmond is currently in the action stage of change and ready to dedicate time achieving and maintaining a healthier weight. Casey Richmond is interested in becoming our patient and working on intensive lifestyle modifications including (but not limited to) diet and exercise for weight loss.  Casey Richmond's habits were reviewed today and are as follows: her desired weight loss is 137-157 lbs, she started gaining weight in high school, her heaviest weight ever was 337 pounds, she has significant food cravings issues, she snacks frequently in the evenings, she skips meals frequently, she is frequently drinking liquids with calories, she frequently makes poor food choices, she has problems with excessive hunger, she frequently eats larger portions than normal, she has binge eating behaviors and she struggles with emotional eating.  Depression Screen Casey Richmond (modified PHQ-9) score was 19.  Depression screen PHQ 2/9 06/07/2020  Decreased Interest 3  Down, Depressed, Hopeless 3  PHQ - 2 Score 6  Altered sleeping 1  Tired, decreased energy 2  Change in appetite 3  Feeling bad or failure about yourself  3  Trouble concentrating 1  Moving slowly or fidgety/restless 0  Suicidal thoughts 3  PHQ-9 Score 19  Difficult doing work/chores Very difficult   Subjective:   1. Other fatigue Casey Richmond admits to daytime somnolence and admits to waking up still tired. Casey Richmond has a history of symptoms of daytime fatigue. Casey Richmond generally gets 6 or 8  hours of sleep per night, and states that she has generally restful sleep. Snoring is present. Apneic episodes are not present. Epworth Sleepiness Score is 4.  2. Shortness of breath on exertion Casey Richmond notes increasing shortness of breath with exercising and seems to be worsening over time with weight gain. She notes getting out of breath sooner with activity than she used to. This has not gotten worse recently. Casey Richmond denies shortness of breath at rest or orthopnea.  3. Hyperglycemia Casey Richmond is with a history of polycystic ovarian syndrome and anemia. She is followed by OBGYN, and she is working on weight loss.  4. Binge eating episodes Casey Richmond has a history of some binge eating behaviors, usually 2-3 times per week. She is struggling with her weight and this is contributing.   5. At risk for heart disease Casey Richmond is at a higher than average risk for cardiovascular disease due to obesity.   Assessment/Plan:   1. Other fatigue Casey Richmond does feel that her weight is causing her energy to be lower than it should be. Fatigue may be related to obesity, depression or many other causes. Labs will be ordered, and in the meanwhile, Casey Richmond will focus on self care including making healthy food choices, increasing physical activity and focusing on stress reduction.  - EKG 12-Lead - CBC with Differential/Platelet - Comprehensive metabolic panel - Folate - Lipid panel - T3 - T4 - TSH - VITAMIN D 25 Hydroxy (Vit-D Deficiency, Fractures)  2. Shortness of breath on exertion Casey Richmond does feel that she gets out of breath more easily that she used to when she exercises. Casey Richmond's shortness  of breath appears to be obesity related and exercise induced. She has agreed to work on weight loss and gradually increase exercise to treat her exercise induced shortness of breath. Will continue to monitor closely.  3. Hyperglycemia Fasting labs will be obtained today, and results with be discussed with Casey Richmond in  2 weeks at her follow up visit. In the meanwhile Casey Richmond will start her Category 3 plan and will work on weight loss efforts.  - Vitamin B12 - Hemoglobin A1c - Insulin, random  4. Binge eating episodes We will refer to Dr. Dewaine Richmond, out Bariatric Psychologist for further evaluation.  5. Depression screening Casey Richmond had a positive depression screening. Depression is commonly associated with obesity and often results in emotional eating behaviors. We will monitor this closely and work on CBT to help improve the non-hunger eating patterns. Referral to Psychology may be required if no improvement is seen as she continues in our clinic.  6. At risk for heart disease Casey Richmond was given approximately 30 minutes of coronary artery disease prevention counseling today. She is 24 y.o. female and has risk factors for heart disease including obesity. We discussed intensive lifestyle modifications today with an emphasis on specific weight loss instructions and strategies.   Repetitive spaced learning was employed today to elicit superior memory formation and behavioral change.  7. Class 3 severe obesity with serious comorbidity and body mass index (BMI) of 50.0 to 59.9 in adult, unspecified obesity type (HCC) Casey Richmond is currently in the action stage of change and her goal is to continue with weight loss efforts. I recommend Echo begin the structured treatment plan as follows:  Casey Richmond was advised to look at [employer] and schedule an information session to start.  She has agreed to the Category 3 Plan.  Exercise goals: No exercise has been prescribed for now, while we concentrate on nutritional changes.  Behavioral modification strategies: increasing lean protein intake and no skipping meals.  She was informed of the importance of frequent follow-up visits to maximize her success with intensive lifestyle modifications for her multiple health conditions. She was informed we would  discuss her lab results at her next visit unless there is a critical issue that needs to be addressed sooner. Casey Richmond agreed to keep her next visit at the agreed upon time to discuss these results.  Objective:   Blood pressure 118/69, pulse (!) 101, temperature 98 F (36.7 C), height 5\' 3"  (1.6 m), weight (!) 337 lb (152.9 kg), last menstrual period 05/04/2020, SpO2 97 %. Body mass index is 59.7 kg/m.  EKG: Normal sinus rhythm, rate 101 BPM, tachycardia.  Indirect Calorimeter completed today shows a VO2 of 272 and a REE of 1893.  Her calculated basal metabolic rate is 05/06/2020 thus her basal metabolic rate is worse than expected.  General: Cooperative, alert, well developed, in no acute distress. HEENT: Conjunctivae and lids unremarkable. Cardiovascular: Regular rhythm.  Lungs: Normal work of breathing. Neurologic: No focal deficits.   Lab Results  Component Value Date   CREATININE 0.71 06/07/2020   BUN 10 06/07/2020   NA 138 06/07/2020   K 4.3 06/07/2020   CL 101 06/07/2020   CO2 23 06/07/2020   Lab Results  Component Value Date   ALT 12 06/07/2020   AST 10 06/07/2020   ALKPHOS 54 06/07/2020   BILITOT <0.2 06/07/2020   Lab Results  Component Value Date   HGBA1C 5.7 (H) 06/07/2020   HGBA1C 5.4 05/21/2020   Lab Results  Component Value  Date   INSULIN 92.9 (H) 06/07/2020   Lab Results  Component Value Date   TSH 1.710 06/07/2020   Lab Results  Component Value Date   CHOL 142 06/07/2020   HDL 55 06/07/2020   LDLCALC 74 06/07/2020   TRIG 62 06/07/2020   CHOLHDL 2.6 06/07/2020   Lab Results  Component Value Date   WBC 8.4 06/07/2020   HGB 8.3 (L) 06/07/2020   HCT 31.5 (L) 06/07/2020   MCV 67 (L) 06/07/2020   PLT 387 06/07/2020   No results found for: IRON, TIBC, FERRITIN  Attestation Statements:   Reviewed by clinician on day of visit: allergies, medications, problem list, medical history, surgical history, family history, social history, and previous  encounter notes.   I, Burt Knack, am acting as transcriptionist for Quillian Quince, MD.  I have reviewed the above documentation for accuracy and completeness, and I agree with the above. - Quillian Quince, MD

## 2020-06-21 ENCOUNTER — Other Ambulatory Visit: Payer: Self-pay

## 2020-06-21 ENCOUNTER — Encounter (INDEPENDENT_AMBULATORY_CARE_PROVIDER_SITE_OTHER): Payer: Self-pay | Admitting: Family Medicine

## 2020-06-21 ENCOUNTER — Ambulatory Visit (INDEPENDENT_AMBULATORY_CARE_PROVIDER_SITE_OTHER): Payer: BC Managed Care – PPO | Admitting: Family Medicine

## 2020-06-21 VITALS — BP 125/72 | HR 90 | Temp 98.1°F | Ht 63.0 in | Wt 331.0 lb

## 2020-06-21 DIAGNOSIS — Z9189 Other specified personal risk factors, not elsewhere classified: Secondary | ICD-10-CM | POA: Diagnosis not present

## 2020-06-21 DIAGNOSIS — Z6841 Body Mass Index (BMI) 40.0 and over, adult: Secondary | ICD-10-CM

## 2020-06-21 DIAGNOSIS — D508 Other iron deficiency anemias: Secondary | ICD-10-CM

## 2020-06-21 DIAGNOSIS — R7303 Prediabetes: Secondary | ICD-10-CM

## 2020-06-21 DIAGNOSIS — E559 Vitamin D deficiency, unspecified: Secondary | ICD-10-CM | POA: Diagnosis not present

## 2020-06-21 MED ORDER — FERROUS SULFATE 325 (65 FE) MG PO TABS: 325.0000 mg | ORAL_TABLET | Freq: Every day | ORAL | 0 refills | Status: DC

## 2020-06-21 MED ORDER — METFORMIN HCL 500 MG PO TABS: 500.0000 mg | ORAL_TABLET | Freq: Every morning | ORAL | 0 refills | Status: DC

## 2020-06-21 MED ORDER — VITAMIN D (ERGOCALCIFEROL) 1.25 MG (50000 UNIT) PO CAPS: 50000.0000 [IU] | ORAL_CAPSULE | ORAL | 0 refills | Status: DC

## 2020-06-21 NOTE — Progress Notes (Signed)
Chief Complaint:   OBESITY Casey Richmond is here to discuss her progress with her obesity treatment plan along with follow-up of her obesity related diagnoses. Casey Richmond is on the Category 3 Plan and states she is following her eating plan approximately 75% of the time. Casey Richmond states she is doing 0 minutes 0 times per week.  Today's visit was #: 2 Starting weight: 337 lbs Starting date: 06/07/2020 Today's weight: 331 lbs Today's date: 06/21/2020 Total lbs lost to date: 6 Total lbs lost since last in-office visit: 6  Interim History: Shalawn has done well with weight loss on her Category 3 plan. Her hunger and snacking have been a challenge however.  Subjective:   1. Pre-diabetes Casey Richmond has a new diagnosis of pre-diabetes. Her A1c and fasting insulin are elevated. She notes polyphagia even on a higher protein, lower simple carbohydrate diet. I discussed labs with the patient today.   2. Vitamin D deficiency Casey Richmond has a new diagnosis of Vit D deficiency. Her Vit D level was low, and she notes fatigue. I discussed labs with the patient today.  3. Other iron deficiency anemia Casey Richmond has a history of anemia with heavier menses. She is on BCP with iron now but her hemoglobin is still very low. She denies palpitations or lightheadedness. I discussed labs with the patient today.  4. At risk for diabetes mellitus Casey Richmond is at higher than average risk for developing diabetes due to her obesity.   Assessment/Plan:   1. Pre-diabetes Casey Richmond will continue to work on weight loss, exercise, and decreasing simple carbohydrates to help decrease the risk of diabetes. Earsie agreed to start metformin 500 mg q AM, with no refills. The risk of diabetes mellitus was explained as well as how this effects her weight. We will continue to follow closely.    - metFORMIN (GLUCOPHAGE) 500 MG tablet; Take 1 tablet (500 mg total) by mouth every morning.  Dispense: 30 tablet; Refill: 0  2. Vitamin D  deficiency Low Vitamin D level contributes to fatigue and are associated with obesity, breast, and colon cancer. Casey Richmond agreed to start prescription Vitamin D 50,000 IU every week, with no refills. She will follow-up for routine testing of Vitamin D, at least 2-3 times per year to avoid over-replacement.  - Vitamin D, Ergocalciferol, (DRISDOL) 1.25 MG (50000 UNIT) CAPS capsule; Take 1 capsule (50,000 Units total) by mouth every 7 (seven) days.  Dispense: 4 capsule; Refill: 0  3. Other iron deficiency anemia Casey Richmond agreed to start FeSo4 325 mg q daily, with no refills, in addition to her BCP with iron. Orders and follow up as documented in patient record.  Counseling . Iron is essential for our bodies to make red blood cells.  Reasons that someone may be deficient include: an iron-deficient diet (more likely in those following vegan or vegetarian diets), women with heavy menses, patients with GI disorders or poor absorption, patients that have had bariatric surgery, frequent blood donors, patients with cancer, and patients with heart disease.   [place] foods include dark leafy greens, red and white meats, eggs, seafood, and beans.   . Certain foods and drinks prevent your body from absorbing iron properly. Avoid eating these foods in the same meal as iron-rich foods or with iron supplements. These foods include: coffee, black tea, and red wine; milk, dairy products, and foods that are high in calcium; beans and soybeans; whole grains.  . Constipation can be a side effect of iron supplementation. Increased water and fiber  intake are helpful. Water goal: > 2 liters/day. Fiber goal: > 25 grams/day.  - ferrous sulfate 325 (65 FE) MG tablet; Take 1 tablet (325 mg total) by mouth daily with breakfast.  Dispense: 30 tablet; Refill: 0  4. At risk for diabetes mellitus Casey Richmond was given approximately 30 minutes of diabetes education and counseling today. We discussed intensive lifestyle modifications  today with an emphasis on weight loss as well as increasing exercise and decreasing simple carbohydrates in her diet. We also reviewed medication options with an emphasis on risk versus benefit of those discussed.   Repetitive spaced learning was employed today to elicit superior memory formation and behavioral change.  5. Class 3 severe obesity with serious comorbidity and body mass index (BMI) of 50.0 to 59.9 in adult, unspecified obesity type (HCC) Casey Richmond is currently in the action stage of change. As such, her goal is to continue with weight loss efforts. She has agreed to the Category 3 Plan.   Behavioral modification strategies: increasing lean protein intake, decreasing simple carbohydrates, meal planning and cooking strategies and better snacking choices.  Casey Richmond has agreed to follow-up with our clinic in 2 weeks. She was informed of the importance of frequent follow-up visits to maximize her success with intensive lifestyle modifications for her multiple health conditions.   Objective:   Blood pressure 125/72, pulse 90, temperature 98.1 F (36.7 C), height 5\' 3"  (1.6 m), weight (!) 331 lb (150.1 kg), last menstrual period 05/04/2020, SpO2 96 %. Body mass index is 58.63 kg/m.  General: Cooperative, alert, well developed, in no acute distress. HEENT: Conjunctivae and lids unremarkable. Cardiovascular: Regular rhythm.  Lungs: Normal work of breathing. Neurologic: No focal deficits.   Lab Results  Component Value Date   CREATININE 0.71 06/07/2020   BUN 10 06/07/2020   NA 138 06/07/2020   K 4.3 06/07/2020   CL 101 06/07/2020   CO2 23 06/07/2020   Lab Results  Component Value Date   ALT 12 06/07/2020   AST 10 06/07/2020   ALKPHOS 54 06/07/2020   BILITOT <0.2 06/07/2020   Lab Results  Component Value Date   HGBA1C 5.7 (H) 06/07/2020   HGBA1C 5.4 05/21/2020   Lab Results  Component Value Date   INSULIN 92.9 (H) 06/07/2020   Lab Results  Component Value Date   TSH  1.710 06/07/2020   Lab Results  Component Value Date   CHOL 142 06/07/2020   HDL 55 06/07/2020   LDLCALC 74 06/07/2020   TRIG 62 06/07/2020   CHOLHDL 2.6 06/07/2020   Lab Results  Component Value Date   WBC 8.4 06/07/2020   HGB 8.3 (L) 06/07/2020   HCT 31.5 (L) 06/07/2020   MCV 67 (L) 06/07/2020   PLT 387 06/07/2020   No results found for: IRON, TIBC, FERRITIN  Attestation Statements:   Reviewed by clinician on day of visit: allergies, medications, problem list, medical history, surgical history, family history, social history, and previous encounter notes.   I, 06/09/2020, am acting as transcriptionist for Burt Knack, MD.  I have reviewed the above documentation for accuracy and completeness, and I agree with the above. -  Quillian Quince, MD

## 2020-06-23 DIAGNOSIS — Z20822 Contact with and (suspected) exposure to covid-19: Secondary | ICD-10-CM | POA: Diagnosis not present

## 2020-06-27 ENCOUNTER — Telehealth (INDEPENDENT_AMBULATORY_CARE_PROVIDER_SITE_OTHER): Payer: BC Managed Care – PPO | Admitting: Psychology

## 2020-07-05 ENCOUNTER — Ambulatory Visit (INDEPENDENT_AMBULATORY_CARE_PROVIDER_SITE_OTHER): Payer: BC Managed Care – PPO | Admitting: Adult Health

## 2020-07-05 ENCOUNTER — Telehealth (INDEPENDENT_AMBULATORY_CARE_PROVIDER_SITE_OTHER): Payer: Self-pay | Admitting: Psychology

## 2020-07-05 NOTE — Telephone Encounter (Signed)
  Office: [phone]  /  Fax: [phone]  Date of Call: July 05, 2020  Time of Call: 11:04am Duration of Call: ~5 minutes Provider: Lawerance Cruel, PsyD  CONTENT:  Casey Richmond called this provider back. She explained she canceled her last appointment due to testing positive for COVID-19. At this time, Casey Richmond indicated she did not have her schedule; therefore, requested to call back to schedule a follow-up appointment. Notably, this provider discussed her upcoming maternity leave toward the end of November. Casey Richmond acknowledged understanding given the uncertain nature of the circumstances, this provider may be out of the office sooner. This provider recommended she continue services with her other therapist and psychiatric provider; she agreed. All questions/concerns were addressed. Casey Richmond denied any concerns. A risk assessment was completed. Casey Richmond denied experiencing suicidal and homicidal ideation, plan, and intent since the last appointment with this provider. She reported she continues to have easy access to the developed safety plan, and continues to acknowledge understanding regarding the importance of reaching out to trusted individuals and/or emergency resources if she is unable to ensure safety.   PLAN: This provider will wait for Casey Richmond to call back. No further follow-up planned by this provider.

## 2020-07-05 NOTE — Telephone Encounter (Signed)
  Office: [phone]  /  Fax: [phone]  Date of Call: July 05, 2020  Time of Call: 11:01am Provider: Lawerance Cruel, PsyD  CONTENT: This provider called [facility] to check-in and schedule a follow-up appointment. A HIPAA compliant voicemail was left requesting a call back.   PLAN: This provider will wait for [place] to call back. No further follow-up planned by this provider.

## 2020-07-10 ENCOUNTER — Ambulatory Visit (INDEPENDENT_AMBULATORY_CARE_PROVIDER_SITE_OTHER): Payer: BC Managed Care – PPO | Admitting: Adult Health

## 2020-07-10 ENCOUNTER — Encounter (INDEPENDENT_AMBULATORY_CARE_PROVIDER_SITE_OTHER): Payer: Self-pay | Admitting: Adult Health

## 2020-07-10 ENCOUNTER — Other Ambulatory Visit: Payer: Self-pay

## 2020-07-10 VITALS — BP 129/77 | HR 98 | Temp 98.2°F | Ht 63.0 in | Wt 335.0 lb

## 2020-07-10 DIAGNOSIS — E559 Vitamin D deficiency, unspecified: Secondary | ICD-10-CM

## 2020-07-10 DIAGNOSIS — R7303 Prediabetes: Secondary | ICD-10-CM

## 2020-07-10 DIAGNOSIS — Z6841 Body Mass Index (BMI) 40.0 and over, adult: Secondary | ICD-10-CM | POA: Diagnosis not present

## 2020-07-11 NOTE — Progress Notes (Signed)
Chief Complaint:   OBESITY Casey Richmond is here to discuss her progress with her obesity treatment plan along with follow-up of her obesity related diagnoses. Casey Richmond is on the Category 3 Plan and states she is following her eating plan approximately 50% of the time. Casey Richmond states she is exercising 0 minutes 0 times per week.  Today's visit was #: 3 Starting weight: 337 lbs Starting date: 06/07/2020 Today's weight: 335 lbs Today's date: 07/10/2020 Total lbs lost to date: 2 Total lbs lost since last in-office visit: 0  Interim History: Casey Richmond had a positive SARS-COV-2 test on 06/23/2020 and was in isolation until 07/06/2020. She denies any lingering symptoms. She is fully vaccinated with Pfizer COVID-19. She has not enjoyed the bread on the Category 3 meal plan and feels very limited with the meals for lunch and dinner. She is interested in other meal plans.  Subjective:   Pre-diabetes. Casey Richmond has a diagnosis of prediabetes based on her elevated HgA1c and was informed this puts her at greater risk of developing diabetes. She continues to work on diet and exercise to decrease her risk of diabetes. She denies nausea or hypoglycemia. 06/07/2020 blood glucose 90, A1c 5.7 with an insulin level of 92.9. Casey Richmond is on metformin 500 mg at breakfast and denies diarrhea. She cannot tell an appreciable decrease in hunger levels.  Lab Results  Component Value Date   HGBA1C 5.7 (H) 06/07/2020   Lab Results  Component Value Date   INSULIN 92.9 (H) 06/07/2020   Vitamin D deficiency. Vitamin D level on 06/07/2020 was 20.9, below goal of 50. Casey Richmond is on Ergocalciferol. No nausea, vomiting, or muscle weakness.    Ref. Range 06/07/2020 10:37  Vitamin D, 25-Hydroxy Latest Ref Range: 30.0 - 100.0 ng/mL 20.9 (L)   Assessment/Plan:   Pre-diabetes. Casey Richmond will continue to work on weight loss, exercise, and decreasing simple carbohydrates to help decrease the risk of diabetes. She will  continue metformin as directed, no need for refill today.  Vitamin D deficiency. Low Vitamin D level contributes to fatigue and are associated with obesity, breast, and colon cancer. She agrees to continue to take Ergocalciferol as directed (no medication refill today) and will follow-up for routine testing of Vitamin D, at least 2-3 times per year to avoid over-replacement.  Class 3 severe obesity with serious comorbidity and body mass index (BMI) of 50.0 to 59.9 in adult, unspecified obesity type (HCC).  Casey Richmond is currently in the action stage of change. As such, her goal is to continue with weight loss efforts. She has agreed to the Category 3 Plan at breakfast; journaling 350-500 calories and 35 grams of protein at lunch; and journaling 450-600 calories and 40 grams of protein at dinner.  Handout was provided on Recipe Guide.   Exercise goals: No exercise has been prescribed at this time.  Behavioral modification strategies: increasing lean protein intake, meal planning and cooking strategies, better snacking choices, planning for success and keeping a strict food journal.  Casey Richmond has agreed to follow-up with our clinic in 2 weeks. She was informed of the importance of frequent follow-up visits to maximize her success with intensive lifestyle modifications for her multiple health conditions.   Objective:   Blood pressure 129/77, pulse 98, temperature 98.2 F (36.8 C), height 5\' 3"  (1.6 m), weight (!) 335 lb (152 kg), SpO2 98 %. Body mass index is 59.34 kg/m.  General: Cooperative, alert, well developed, in no acute distress. HEENT: Conjunctivae and lids unremarkable. Cardiovascular:  Regular rhythm.  Lungs: Normal work of breathing. Neurologic: No focal deficits.   Lab Results  Component Value Date   CREATININE 0.71 06/07/2020   BUN 10 06/07/2020   NA 138 06/07/2020   K 4.3 06/07/2020   CL 101 06/07/2020   CO2 23 06/07/2020   Lab Results  Component Value Date   ALT 12  06/07/2020   AST 10 06/07/2020   ALKPHOS 54 06/07/2020   BILITOT <0.2 06/07/2020   Lab Results  Component Value Date   HGBA1C 5.7 (H) 06/07/2020   HGBA1C 5.4 05/21/2020   Lab Results  Component Value Date   INSULIN 92.9 (H) 06/07/2020   Lab Results  Component Value Date   TSH 1.710 06/07/2020   Lab Results  Component Value Date   CHOL 142 06/07/2020   HDL 55 06/07/2020   LDLCALC 74 06/07/2020   TRIG 62 06/07/2020   CHOLHDL 2.6 06/07/2020   Lab Results  Component Value Date   WBC 8.4 06/07/2020   HGB 8.3 (L) 06/07/2020   HCT 31.5 (L) 06/07/2020   MCV 67 (L) 06/07/2020   PLT 387 06/07/2020   No results found for: IRON, TIBC, FERRITIN  Attestation Statements:   Reviewed by clinician on day of visit: allergies, medications, problem list, medical history, surgical history, family history, social history, and previous encounter notes.  Time spent on visit including pre-visit chart review and post-visit charting and care was 28 minutes.   I, Marianna Payment, am acting as Energy manager for [employer], NP-C   I have reviewed the above documentation for accuracy and completeness, and I agree with the above. -  Casey [place]op[place] d. Robson Trickey, NP-C

## 2020-07-12 DIAGNOSIS — E559 Vitamin D deficiency, unspecified: Secondary | ICD-10-CM | POA: Insufficient documentation

## 2020-07-12 DIAGNOSIS — Z6841 Body Mass Index (BMI) 40.0 and over, adult: Secondary | ICD-10-CM | POA: Insufficient documentation

## 2020-07-12 DIAGNOSIS — R7303 Prediabetes: Secondary | ICD-10-CM | POA: Insufficient documentation

## 2020-07-16 ENCOUNTER — Other Ambulatory Visit (INDEPENDENT_AMBULATORY_CARE_PROVIDER_SITE_OTHER): Payer: Self-pay | Admitting: Family Medicine

## 2020-07-16 DIAGNOSIS — E559 Vitamin D deficiency, unspecified: Secondary | ICD-10-CM

## 2020-07-18 ENCOUNTER — Other Ambulatory Visit (INDEPENDENT_AMBULATORY_CARE_PROVIDER_SITE_OTHER): Payer: Self-pay | Admitting: Family Medicine

## 2020-07-18 DIAGNOSIS — R7303 Prediabetes: Secondary | ICD-10-CM

## 2021-07-01 ENCOUNTER — Other Ambulatory Visit: Payer: Self-pay

## 2021-07-01 ENCOUNTER — Encounter (HOSPITAL_COMMUNITY): Payer: Self-pay

## 2021-07-01 ENCOUNTER — Ambulatory Visit (INDEPENDENT_AMBULATORY_CARE_PROVIDER_SITE_OTHER): Payer: No Payment, Other | Admitting: Psychiatry

## 2021-07-01 DIAGNOSIS — F331 Major depressive disorder, recurrent, moderate: Secondary | ICD-10-CM | POA: Diagnosis not present

## 2021-07-01 MED ORDER — HYDROXYZINE HCL 10 MG PO TABS
10.0000 mg | ORAL_TABLET | Freq: Three times a day (TID) | ORAL | 0 refills | Status: DC | PRN
Start: 2021-07-01 — End: 2021-07-15

## 2021-07-01 MED ORDER — BUPROPION HCL ER (XL) 150 MG PO TB24: 150.0000 mg | ORAL_TABLET | Freq: Every day | ORAL | 1 refills | Status: DC
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
## 2021-07-15 ENCOUNTER — Encounter (HOSPITAL_COMMUNITY): Payer: Self-pay

## 2021-07-15 ENCOUNTER — Telehealth (INDEPENDENT_AMBULATORY_CARE_PROVIDER_SITE_OTHER): Payer: No Payment, Other | Admitting: Psychiatry

## 2021-07-15 DIAGNOSIS — F331 Major depressive disorder, recurrent, moderate: Secondary | ICD-10-CM

## 2021-07-15 MED ORDER — HYDROXYZINE HCL 10 MG PO TABS
10.0000 mg | ORAL_TABLET | Freq: Three times a day (TID) | ORAL | 2 refills | Status: AC | PRN
Start: 2021-07-15 — End: 2021-08-14

## 2021-07-15 MED ORDER — BUPROPION HCL ER (XL) 300 MG PO TB24: 300.0000 mg | ORAL_TABLET | ORAL | 2 refills | Status: DC

## 2021-07-15 NOTE — Progress Notes (Signed)
BH NP OP Progress Notes   Patient Identification: Casey Richmond MRN:  [id number] Date of Evaluation:  07/15/2021 Referral Source: PCP and [facility] Chief Complaint:   Chief Complaint   Anxiety; Follow-up; Depression    Visit Diagnosis:    ICD-10-CM   1. Major depressive disorder, recurrent episode, moderate (HCC)  F33.1       History of Present Illness:   25 year old female presents today with depression follow up.  She started Wellbutrin and it is helping some.  Her depression is a moderate at a 5/10, no suicidal ideations, energy is a little better.  Anxiety is low, no panic attacks.  Her sleep is "good" and the hydroxyzine works (30 mg PRN) at bedtime when she needs it. Appetite is steady, no issues. No psychosis, paranoia, homicidal ideations, or substance abuse.  Denies side effects of her medications, discussed changes and will increase the Wellbutrin to 300 mg.  Past Psychiatric History: depression, anxiety  Previous Psychotropic Medications: No   Substance Abuse History in the last 12 months:  No.  Consequences of Substance Abuse: NA  Past Medical History:  Past Medical History:  Diagnosis Date   Allergies    Anemia    Asthma    Depression    Generalized skin cysts    breasts, back, thighs, armpits   Obesity    PCOS (polycystic ovarian syndrome)    SOB (shortness of breath)     Past Surgical History:  Procedure Laterality Date   COSMETIC SURGERY     left cheek caused by a hematoma at age 52    Family Psychiatric History: father with substance abuse and possibly bipolar d/o, mother with depression and anxiety  Family History:  Family History  Problem Relation Age of Onset   Drug abuse Father    Hypertension Mother    Obesity Mother     Social History:   Social History   Socioeconomic History   Marital status: Single    Spouse name: Danae Chen   Number of children: Not on file   Years of education: Not on file   Highest  education level: Not on file  Occupational History   Occupation: Administrator, [employer]  Tobacco Use   Smoking status: Never   Smokeless tobacco: Never  Substance and Sexual Activity   Alcohol use: Not Currently   Drug use: Never   Sexual activity: Yes    Birth control/protection: None  Other Topics Concern   Not on file  Social History Narrative   Not on file   Social Determinants of Health   Financial Resource Strain: Not on file  Food Insecurity: Not on file  Transportation Needs: Not on file  Physical Activity: Not on file  Stress: Not on file  Social Connections: Not on file    Additional Social History: lives with two roommates  Allergies:   Allergies  Allergen Reactions   Ethinyl Estradiol Nausea And Vomiting and Other (See Comments)   Norethindrone Other (See Comments)    Metabolic Disorder Labs: Lab Results  Component Value Date   HGBA1C 5.7 (H) 06/07/2020   No results found for: PROLACTIN Lab Results  Component Value Date   CHOL 142 06/07/2020   TRIG 62 06/07/2020   HDL 55 06/07/2020   CHOLHDL 2.6 06/07/2020   LDLCALC 74 06/07/2020   Lab Results  Component Value Date   TSH 1.710 06/07/2020    Therapeutic Level Labs: No results found for: LITHIUM No results found for: CBMZ No results  found for: VALPROATE  Current Medications: Current Outpatient Medications  Medication Sig Dispense Refill   buPROPion (WELLBUTRIN XL) 300 MG 24 hr tablet Take 1 tablet (300 mg total) by mouth every morning. 30 tablet 2   AUROVELA FE 1.5/30 1.5-30 MG-MCG tablet Take 1 tablet by mouth at bedtime.     ferrous sulfate 325 (65 FE) MG tablet Take 1 tablet (325 mg total) by mouth daily with breakfast. 30 tablet 0   hydrOXYzine (ATARAX/VISTARIL) 10 MG tablet Take 1 tablet (10 mg total) by mouth 3 (three) times daily as needed for anxiety. May take 30 mg daily at bedtime PRN sleep (3 tablets) 30 tablet 2   metFORMIN (GLUCOPHAGE) 500 MG tablet Take 1 tablet (500 mg total) by  mouth every morning. 30 tablet 0   Vitamin D, Ergocalciferol, (DRISDOL) 1.25 MG (50000 UNIT) CAPS capsule Take 1 capsule (50,000 Units total) by mouth every 7 (seven) days. 4 capsule 0   No current facility-administered medications for this visit.    Musculoskeletal: Strength & Muscle Tone: within normal limits Gait & Station: normal Patient leans: N/A  Psychiatric Specialty Exam: Review of Systems  Psychiatric/Behavioral:  Positive for dysphoric mood. The patient is nervous/anxious.   All other systems reviewed and are negative.  There were no vitals taken for this visit.There is no height or weight on file to calculate BMI.  General Appearance: Casual  Eye Contact:  Good  Speech:  Normal Rate  Volume:  Normal  Mood:  Anxious and Depressed  Affect:  Depressed  Thought Process:  Coherent and Descriptions of Associations: Intact  Orientation:  Full (Time, Place, and Person)  Thought Content:  WDL and Logical  Suicidal Thoughts:  Yes.  without intent/plan  Homicidal Thoughts:  No  Memory:  Immediate;   Good Recent;   Good Remote;   Good  Judgement:  Good  Insight:  Good  Psychomotor Activity:  Decreased  Concentration:  Concentration: Good and Attention Span: Good  Recall:  Good  Fund of Knowledge:Good  Language: Good  Akathisia:  No  Handed:  Right  AIMS (if indicated):  not done  Assets:  Housing Leisure Time Physical Health Resilience Social Support Vocational/Educational  ADL's:  Intact  Cognition: WNL  Sleep:  Good   Screenings: PHQ2-9    Flowsheet Row Office Visit from 07/01/2021 in [facility] Office Visit from 06/07/2020 in [facility]  PHQ-2 Total Score 4 6  PHQ-9 Total Score 16 19      Flowsheet Row Office Visit from 07/01/2021 in [facility]  C-SSRS RISK CATEGORY Low Risk       Assessment and Plan:  Major depressive disorder, recurrent, moderate: -Continue  therapy -Increase Wellbutrin 150 mg to 300 mg daily  Anxiety: -Continue hydroxyzine 10 mg TID PRN anxiety -Follow up in 3 months  Insomnia: -Start hydroxyzine 30 mg daily at bedtime PRN  Virtual Visit via Video Note  I connected with Maybel Tureaud Borin on 07/15/21 at  4:30 PM EDT by a video enabled telemedicine application and verified that I am speaking with the correct person using two identifiers.  Location: Patient: home Provider: home office   I discussed the limitations of evaluation and management by telemedicine and the availability of in person appointments. The patient expressed understanding and agreed to proceed.  Follow Up Instructions: Follow up in 3 months   I discussed the assessment and treatment plan with the patient. The patient was provided an opportunity  to ask questions and all were answered. The patient agreed with the plan and demonstrated an understanding of the instructions.   The patient was advised to call back or seek an in-person evaluation if the symptoms worsen or if the condition fails to improve as anticipated.  I provided 20 minutes of non-face-to-face time during this encounter.   Nanine Means, NP    Nanine Means, NP 10/24/20224:36 PM

## 2021-10-07 ENCOUNTER — Telehealth (HOSPITAL_COMMUNITY): Payer: No Payment, Other

## 2021-10-07 ENCOUNTER — Telehealth (HOSPITAL_COMMUNITY): Payer: Self-pay | Admitting: Psychiatry

## 2021-10-07 NOTE — Telephone Encounter (Signed)
Patient contacted office to speak with provider concerning medications. Patient is not liking the way that Wellbutrin is making her feel and would like to discuss a medication change. Writer informed that provider will respond at earliest convenience, being that she only works with our clinic on Tuesdays.

## 2021-10-29 ENCOUNTER — Encounter (HOSPITAL_COMMUNITY): Payer: Self-pay | Admitting: Psychiatry

## 2021-10-29 ENCOUNTER — Telehealth (INDEPENDENT_AMBULATORY_CARE_PROVIDER_SITE_OTHER): Payer: No Payment, Other | Admitting: Psychiatry

## 2021-10-29 DIAGNOSIS — F322 Major depressive disorder, single episode, severe without psychotic features: Secondary | ICD-10-CM | POA: Diagnosis not present

## 2021-10-29 MED ORDER — HYDROXYZINE HCL 10 MG PO TABS: 10.0000 mg | ORAL_TABLET | Freq: Three times a day (TID) | ORAL | 3 refills | Status: DC | PRN

## 2021-10-29 MED ORDER — ESCITALOPRAM OXALATE 10 MG PO TABS: 10.0000 mg | ORAL_TABLET | Freq: Every day | ORAL | 3 refills | Status: DC

## 2021-10-29 NOTE — Progress Notes (Signed)
BH NP OP Progress Notes   Patient Identification: Casey Richmond MRN:  [id number] Date of Evaluation:  10/29/2021 Referral Source: PCP and [facility] Chief Complaint:   Chief Complaint   Anxiety; Follow-up; Depression    Visit Diagnosis:    ICD-10-CM   1. Major depressive disorder, single episode, severe (HCC)  F32.2       History of Present Illness:   26 year old female presents today with depression follow up.  The Wellbutrin increase made her feel numb and she discontinued it.  Her depression is a 9/10 with 10 being the highest, suicidal ideations at times, no plan or intent.  Two past attempts, lives for her dog and her job/work.  Her therapist moved and re-establish this as it was helpful.  "I feel I worry quite a bit", moderate anxiety, no panic attacks.  Sleep is variable with initiation being the issue related to anxiety.  Her appetite is steady.  Hydroxyzine does assist her anxiety and sleep, will reorder.  Discussed medication options and decided to start Lexapro to assist her mood and anxiety, follow up in one month.  Past Psychiatric History: depression, anxiety  Previous Psychotropic Medications: No   Substance Abuse History in the last 12 months:  No.  Consequences of Substance Abuse: NA  Past Medical History:  Past Medical History:  Diagnosis Date   Allergies    Anemia    Asthma    Depression    Generalized skin cysts    breasts, back, thighs, armpits   Obesity    PCOS (polycystic ovarian syndrome)    SOB (shortness of breath)     Past Surgical History:  Procedure Laterality Date   COSMETIC SURGERY     left cheek caused by a hematoma at age 55    Family Psychiatric History: father with substance abuse and possibly bipolar d/o, mother with depression and anxiety  Family History:  Family History  Problem Relation Age of Onset   Drug abuse Father    Hypertension Mother    Obesity Mother     Social History:   Social History    Socioeconomic History   Marital status: Single    Spouse name: Danae Chen   Number of children: Not on file   Years of education: Not on file   Highest education level: Not on file  Occupational History   Occupation: Administrator, [employer]  Tobacco Use   Smoking status: Never   Smokeless tobacco: Never  Substance and Sexual Activity   Alcohol use: Not Currently   Drug use: Never   Sexual activity: Yes    Birth control/protection: None  Other Topics Concern   Not on file  Social History Narrative   Not on file   Social Determinants of Health   Financial Resource Strain: Not on file  Food Insecurity: Not on file  Transportation Needs: Not on file  Physical Activity: Not on file  Stress: Not on file  Social Connections: Not on file    Additional Social History: lives with two roommates  Allergies:   Allergies  Allergen Reactions   Ethinyl Estradiol Nausea And Vomiting and Other (See Comments)   Norethindrone Other (See Comments)    Metabolic Disorder Labs: Lab Results  Component Value Date   HGBA1C 5.7 (H) 06/07/2020   No results found for: PROLACTIN Lab Results  Component Value Date   CHOL 142 06/07/2020   TRIG 62 06/07/2020   HDL 55 06/07/2020   CHOLHDL 2.6 06/07/2020   LDLCALC 74  06/07/2020   Lab Results  Component Value Date   TSH 1.710 06/07/2020    Therapeutic Level Labs: No results found for: LITHIUM No results found for: CBMZ No results found for: VALPROATE  Current Medications: Current Outpatient Medications  Medication Sig Dispense Refill   AUROVELA FE 1.5/30 1.5-30 MG-MCG tablet Take 1 tablet by mouth at bedtime.     ferrous sulfate 325 (65 FE) MG tablet Take 1 tablet (325 mg total) by mouth daily with breakfast. 30 tablet 0   metFORMIN (GLUCOPHAGE) 500 MG tablet Take 1 tablet (500 mg total) by mouth every morning. 30 tablet 0   Vitamin D, Ergocalciferol, (DRISDOL) 1.25 MG (50000 UNIT) CAPS capsule Take 1 capsule (50,000 Units total) by  mouth every 7 (seven) days. 4 capsule 0   No current facility-administered medications for this visit.    Musculoskeletal: Strength & Muscle Tone: within normal limits Gait & Station: normal Patient leans: N/A  Psychiatric Specialty Exam: Review of Systems  Psychiatric/Behavioral:  Positive for dysphoric mood. The patient is nervous/anxious.   All other systems reviewed and are negative.  There were no vitals taken for this visit.There is no height or weight on file to calculate BMI.  General Appearance: Casual  Eye Contact:  Good  Speech:  Normal Rate  Volume:  Normal  Mood:  Anxious and Depressed  Affect:  Depressed  Thought Process:  Coherent and Descriptions of Associations: Intact  Orientation:  Full (Time, Place, and Person)  Thought Content:  WDL and Logical  Suicidal Thoughts:  Yes.  without intent/plan  Homicidal Thoughts:  No  Memory:  Immediate;   Good Recent;   Good Remote;   Good  Judgement:  Good  Insight:  Good  Psychomotor Activity:  WDL  Concentration:  Concentration: Good and Attention Span: Good  Recall:  Good  Fund of Knowledge:Good  Language: Good  Akathisia:  No  Handed:  Right  AIMS (if indicated):  not done  Assets:  Housing Leisure Time Physical Health Resilience Social Support Vocational/Educational  ADL's:  Intact  Cognition: WNL  Sleep:  Good   Screenings: PHQ2-9    Flowsheet Row Office Visit from 07/01/2021 in [facility] Office Visit from 06/07/2020 in [facility]  PHQ-2 Total Score 4 6  PHQ-9 Total Score 16 19      Flowsheet Row Office Visit from 07/01/2021 in [facility]  C-SSRS RISK CATEGORY Low Risk       Assessment and Plan:  Major depressive disorder, recurrent, moderate: -Continue therapy -  Anxiety: -Continue hydroxyzine 10 mg TID PRN anxiety -Follow up in 1 month  Insomnia: -Start hydroxyzine 30 mg daily at bedtime PRN  Virtual  Visit via Video Note  I connected with Casey Richmond on 10/29/21 at  9:00 AM EST by a video enabled telemedicine application and verified that I am speaking with the correct person using two identifiers.  Location: Patient: home Provider: home office   I discussed the limitations of evaluation and management by telemedicine and the availability of in person appointments. The patient expressed understanding and agreed to proceed.  Follow Up Instructions: Follow up in 1 month   I discussed the assessment and treatment plan with the patient. The patient was provided an opportunity to ask questions and all were answered. The patient agreed with the plan and demonstrated an understanding of the instructions.   The patient was advised to call back or seek an in-person evaluation if  the symptoms worsen or if the condition fails to improve as anticipated.  I provided 20 minutes of non-face-to-face time during this encounter.   Nanine Means, NP    Nanine Means, NP 2/7/20239:05 AM

## 2021-12-10 ENCOUNTER — Telehealth (INDEPENDENT_AMBULATORY_CARE_PROVIDER_SITE_OTHER): Payer: No Payment, Other | Admitting: Psychiatry

## 2021-12-10 ENCOUNTER — Encounter (HOSPITAL_COMMUNITY): Payer: Self-pay | Admitting: Psychiatry

## 2021-12-10 DIAGNOSIS — F322 Major depressive disorder, single episode, severe without psychotic features: Secondary | ICD-10-CM

## 2021-12-10 MED ORDER — ESCITALOPRAM OXALATE 10 MG PO TABS: 10.0000 mg | ORAL_TABLET | Freq: Every day | ORAL | 3 refills | Status: DC

## 2021-12-10 MED ORDER — TRAZODONE HCL 50 MG PO TABS: 50.0000 mg | ORAL_TABLET | Freq: Every day | ORAL | 2 refills | Status: DC

## 2021-12-10 MED ORDER — HYDROXYZINE HCL 10 MG PO TABS: 10.0000 mg | ORAL_TABLET | Freq: Three times a day (TID) | ORAL | 3 refills | Status: DC | PRN

## 2021-12-10 NOTE — Progress Notes (Signed)
[place] NP OP Progress Notes  ? ?Patient Identification: Casey Richmond ?MRN:  [id number] ?Date of Evaluation:  12/10/2021 ?Referral Source: PCP and [facility] ?Chief Complaint:   ?Chief Complaint   ?Anxiety; Depression; Follow-up ?  ? ?Visit Diagnosis:  ?  ICD-10-CM   ?1. Major depressive disorder, single episode, severe ([place])  F32.2   ?  ? ? ?History of Present Illness:   ?26 year old female presents today with depression follow up.  Her depression continues to be high, she ran out of her medication last week despite having refills in place.  Passive suicidal ideations at times, no intent or plan.  "I am not having the best day today. I don't have an active plan."  Two past attempts with one hospitalization at the age of 62 and a few months ago with no hospitalization.  Work is stressful at times, "It's ok".  Supportive boss, only part time.  Her mother is a good support system but she lives in [place] now.  Her sleep is "ok" with initiation difficult at times related to anxiety.  Moderate anxiety with no panic attacks.  Her appetite is "a little low".  She enjoys going outside when she is feeling better and going to coffee shops and book.  Things are better when she can get outside, encouraged her to take her breaks at work to walk.   ? ?Past Psychiatric History: depression, anxiety ? ?Previous Psychotropic Medications: No  ? ?Substance Abuse History in the last 12 months:  No. ? ?Consequences of Substance Abuse: ?NA ? ?Past Medical History:  ?Past Medical History:  ?Diagnosis Date  ? Allergies   ? Anemia   ? Asthma   ? Depression   ? Generalized skin cysts   ? breasts, back, thighs, armpits  ? Obesity   ? PCOS (polycystic ovarian syndrome)   ? SOB (shortness of breath)   ?  ?Past Surgical History:  ?Procedure Laterality Date  ? COSMETIC SURGERY    ? left cheek caused by a hematoma at age 62  ? ? ?Family Psychiatric History: father with substance abuse and possibly bipolar d/o, mother with depression  and anxiety ? ?Family History:  ?Family History  ?Problem Relation Age of Onset  ? Drug abuse Father   ? Hypertension Mother   ? Obesity Mother   ? ? ?Social History:   ?Social History  ? ?Socioeconomic History  ? Marital status: Single  ?  Spouse name: Sherri Rad  ? Number of children: Not on file  ? Years of education: Not on file  ? Highest education level: Not on file  ?Occupational History  ? Occupation: Geophysical data processor  ?Tobacco Use  ? Smoking status: Never  ? Smokeless tobacco: Never  ?Substance and Sexual Activity  ? Alcohol use: Not Currently  ? Drug use: Never  ? Sexual activity: Yes  ?  Birth control/protection: None  ?Other Topics Concern  ? Not on file  ?Social History Narrative  ? Not on file  ? ?Social Determinants of Health  ? ?Financial Resource Strain: Not on file  ?Food Insecurity: Not on file  ?Transportation Needs: Not on file  ?Physical Activity: Not on file  ?Stress: Not on file  ?Social Connections: Not on file  ? ? ?Additional Social History: lives with two roommates ? ?Allergies:   ?Allergies  ?Allergen Reactions  ? Ethinyl Estradiol Nausea And Vomiting and Other (See Comments)  ? Norethindrone Other (See Comments)  ? ? ?Metabolic Disorder Labs: ?Lab Results  ?  Component Value Date  ? HGBA1C 5.7 (H) 06/07/2020  ? ?No results found for: PROLACTIN ?Lab Results  ?Component Value Date  ? CHOL 142 06/07/2020  ? TRIG 62 06/07/2020  ? HDL 55 06/07/2020  ? CHOLHDL 2.6 06/07/2020  ? [place] 74 06/07/2020  ? ?Lab Results  ?Component Value Date  ? TSH 1.710 06/07/2020  ? ? ?Therapeutic Level Labs: ?No results found for: LITHIUM ?No results found for: CBMZ ?No results found for: VALPROATE ? ?Current Medications: ?Current Outpatient Medications  ?Medication Sig Dispense Refill  ? AUROVELA FE 1.5/30 1.5-30 MG-MCG tablet Take 1 tablet by mouth at bedtime.    ? escitalopram (LEXAPRO) 10 MG tablet Take 1 tablet (10 mg total) by mouth daily. Take 1/2 tablet daily for one week, then increase it to one  tablet daily. 30 tablet 3  ? ferrous sulfate 325 (65 FE) MG tablet Take 1 tablet (325 mg total) by mouth daily with breakfast. 30 tablet 0  ? hydrOXYzine (ATARAX) 10 MG tablet Take 1 tablet (10 mg total) by mouth 3 (three) times daily as needed for anxiety. 180 tablet 3  ? metFORMIN (GLUCOPHAGE) 500 MG tablet Take 1 tablet (500 mg total) by mouth every morning. 30 tablet 0  ? Vitamin D, Ergocalciferol, (DRISDOL) 1.25 MG (50000 UNIT) CAPS capsule Take 1 capsule (50,000 Units total) by mouth every 7 (seven) days. 4 capsule 0  ? ?No current facility-administered medications for this visit.  ? ? ?Musculoskeletal: ?Strength & Muscle Tone: within normal limits ?Gait & Station: normal ?Patient leans: N/A ? ?Psychiatric Specialty Exam: ?Review of Systems  ?Psychiatric/Behavioral:  Positive for dysphoric mood. The patient is nervous/anxious.   ?All other systems reviewed and are negative.  ?There were no vitals taken for this visit.There is no height or weight on file to calculate BMI.  ?General Appearance: Casual  ?Eye Contact:  Good  ?Speech:  Normal Rate  ?Volume:  Normal  ?Mood:  Anxious and Depressed  ?Affect:  Depressed  ?Thought Process:  Coherent and Descriptions of Associations: Intact  ?Orientation:  Full (Time, Place, and Person)  ?Thought Content:  WDL and Logical  ?Suicidal Thoughts:  Yes.  without intent/plan  ?Homicidal Thoughts:  No  ?Memory:  Immediate;   Good ?Recent;   Good ?Remote;   Good  ?Judgement:  Good  ?Insight:  Good  ?Psychomotor Activity:  WDL  ?Concentration:  Concentration: Good and Attention Span: Good  ?Recall:  Good  ?Fund of [place]  ?Language: Good  ?Akathisia:  No  ?Handed:  Right  ?AIMS (if indicated):  not done  ?Assets:  Housing ?Leisure Time ?Physical Health ?Resilience ?Social Support ?Vocational/Educational  ?ADL's:  Intact  ?Cognition: WNL  ?Sleep:  Good  ? ?Screenings: ?PHQ2-9   ? ?[place] Office Visit from 07/01/2021 in [facility] Office  Visit from 06/07/2020 in [place]  ?PHQ-2 Total Score 4 6  ?PHQ-9 Total Score 16 19  ? ?  ? ?[place] Office Visit from 07/01/2021 in [facility]  ?C-SSRS RISK CATEGORY Low Risk  ? ?  ? ? ?Assessment and Plan:  ?Major depressive disorder, recurrent, moderate: ?-Continue therapy ?-Continue Lexapro 10 mg daily ? ?Anxiety: ?-Continue hydroxyzine 10 mg TID PRN anxiety ?-Follow up in 1 month ? ?Insomnia: ?-Continue hydroxyzine 30 mg daily at bedtime PRN or ?Started Trazodone 25-50 mg daily PRN sleep ? ?Virtual Visit via Video Note ? ?I connected with Casey Richmond on 12/10/21 at  2:30  PM EDT by a video enabled telemedicine application and verified that I am speaking with the correct person using two identifiers. ? ?Location: ?Patient: home ?Provider: home office ?  ?I discussed the limitations of evaluation and management by telemedicine and the availability of in person appointments. The patient expressed understanding and agreed to proceed. ? ?Follow Up Instructions: ?Follow up in 4-5 weeks ?  ?I discussed the assessment and treatment plan with the patient. The patient was provided an opportunity to ask questions and all were answered. The patient agreed with the plan and demonstrated an understanding of the instructions. ?  ?The patient was advised to call back or seek an in-person evaluation if the symptoms worsen or if the condition fails to improve as anticipated. ? ?I provided 20 minutes of non-face-to-face time during this encounter. ? ? ?Waylan Boga, NP  ? ? ?Waylan Boga, NP ?3/21/20232:39 PM ? ?

## 2022-01-07 ENCOUNTER — Telehealth (INDEPENDENT_AMBULATORY_CARE_PROVIDER_SITE_OTHER): Payer: No Payment, Other | Admitting: Psychiatry

## 2022-01-07 ENCOUNTER — Encounter (HOSPITAL_COMMUNITY): Payer: Self-pay | Admitting: Psychiatry

## 2022-01-07 ENCOUNTER — Telehealth (HOSPITAL_COMMUNITY): Payer: No Payment, Other | Admitting: Psychiatry

## 2022-01-07 DIAGNOSIS — F331 Major depressive disorder, recurrent, moderate: Secondary | ICD-10-CM | POA: Diagnosis not present

## 2022-01-07 MED ORDER — HYDROXYZINE HCL 10 MG PO TABS: 10.0000 mg | ORAL_TABLET | Freq: Three times a day (TID) | ORAL | 3 refills | Status: DC | PRN

## 2022-01-07 MED ORDER — ESCITALOPRAM OXALATE 10 MG PO TABS: 10.0000 mg | ORAL_TABLET | Freq: Every day | ORAL | 0 refills | Status: DC

## 2022-01-07 MED ORDER — TRAZODONE HCL 50 MG PO TABS: 50.0000 mg | ORAL_TABLET | Freq: Every day | ORAL | 2 refills | Status: DC

## 2022-01-07 NOTE — Progress Notes (Signed)
[facility] NP OP Progress Notes  ? ?Patient Identification: Casey SizerAzzarea Tureaud Borin ?MRN:  [id number] ?Date of Evaluation:  01/07/2022 ?[facility] ?Chief Complaint:   ?Chief Complaint   ?Anxiety; Depression; Follow-up ?  ? ?Visit Diagnosis:  ?  ICD-10-CM   ?1. Major depressive disorder, recurrent episode, moderate (HCC)  F33.1   ?  ? ? ?History of Present Illness:   ?26 year old female presents today with depression follow up.  Moderate depression with occasional intermittent passive suicidal ideations.  She has a new therapist and it is going well.  Anxiety fluctuates with stressors but no panic attacks.  Sleep is "good" with the Trazodone, only takes half the dose (25 mg) as the full 50 mg was too strong.  Appetite is "good", no weight changes.  Some decrease in libido from the Lexapro, will continue to monitor, benefit is out weighing the side effect at this time.  Refills provided. ? ?Past Psychiatric History: depression, anxiety ? ?Past Medical History:  ?Past Medical History:  ?Diagnosis Date  ? Allergies   ? Anemia   ? Asthma   ? Depression   ? Generalized skin cysts   ? breasts, back, thighs, armpits  ? Obesity   ? PCOS (polycystic ovarian syndrome)   ? SOB (shortness of breath)   ?  ?Past Surgical History:  ?Procedure Laterality Date  ? COSMETIC SURGERY    ? left cheek caused by a hematoma at age 89  ? ? ?Family Psychiatric History: father with substance abuse and possibly bipolar d/o, mother with depression and anxiety ? ?Family History:  ?Family History  ?Problem Relation Age of Onset  ? Drug abuse Father   ? Hypertension Mother   ? Obesity Mother   ? ? ?Social History:   ?Social History  ? ?Socioeconomic History  ? Marital status: Single  ?  Spouse name: Danae ChenCharles Norris  ? Number of children: Not on file  ? Years of education: Not on file  ? Highest education level: Not on file  ?Occupational History  ? Occupation: Administrator, [employer]  ?Tobacco Use  ? Smoking status: Never  ? Smokeless  tobacco: Never  ?Substance and Sexual Activity  ? Alcohol use: Not Currently  ? Drug use: Never  ? Sexual activity: Yes  ?  Birth control/protection: None  ?Other Topics Concern  ? Not on file  ?Social History Narrative  ? Not on file  ? ?Social Determinants of Health  ? ?Financial Resource Strain: Not on file  ?Food Insecurity: Not on file  ?Transportation Needs: Not on file  ?Physical Activity: Not on file  ?Stress: Not on file  ?Social Connections: Not on file  ? ? ?Additional Social History: lives with two roommates ? ?Allergies:   ?Allergies  ?Allergen Reactions  ? Ethinyl Estradiol Nausea And Vomiting and Other (See Comments)  ? Norethindrone Other (See Comments)  ? ? ?Metabolic Disorder Labs: ?Lab Results  ?Component Value Date  ? HGBA1C 5.7 (H) 06/07/2020  ? ?No results found for: PROLACTIN ?Lab Results  ?Component Value Date  ? CHOL 142 06/07/2020  ? TRIG 62 06/07/2020  ? HDL 55 06/07/2020  ? CHOLHDL 2.6 06/07/2020  ? LDLCALC 74 06/07/2020  ? ?Lab Results  ?Component Value Date  ? TSH 1.710 06/07/2020  ? ? ?Therapeutic Level Labs: ?No results found for: LITHIUM ?No results found for: CBMZ ?No results found for: VALPROATE ? ?Current Medications: ?Current Outpatient Medications  ?Medication Sig Dispense Refill  ? AUROVELA FE 1.5/30 1.5-30 MG-MCG  tablet Take 1 tablet by mouth at bedtime.    ? escitalopram (LEXAPRO) 10 MG tablet Take 1 tablet (10 mg total) by mouth daily. Take 1/2 tablet daily for one week, then increase it to one tablet daily. 30 tablet 3  ? ferrous sulfate 325 (65 FE) MG tablet Take 1 tablet (325 mg total) by mouth daily with breakfast. 30 tablet 0  ? hydrOXYzine (ATARAX) 10 MG tablet Take 1 tablet (10 mg total) by mouth 3 (three) times daily as needed for anxiety. 180 tablet 3  ? metFORMIN (GLUCOPHAGE) 500 MG tablet Take 1 tablet (500 mg total) by mouth every morning. 30 tablet 0  ? traZODone (DESYREL) 50 MG tablet Take 1 tablet (50 mg total) by mouth at bedtime. 30 tablet 2  ? Vitamin D,  Ergocalciferol, (DRISDOL) 1.25 MG (50000 UNIT) CAPS capsule Take 1 capsule (50,000 Units total) by mouth every 7 (seven) days. 4 capsule 0  ? ?No current facility-administered medications for this visit.  ? ? ?Musculoskeletal: ?Strength & Muscle Tone: within normal limits ?Gait & Station: normal ?Patient leans: N/A ? ?Psychiatric Specialty Exam: ?Review of Systems  ?Psychiatric/Behavioral:  Positive for dysphoric mood. The patient is nervous/anxious.   ?All other systems reviewed and are negative.  ?There were no vitals taken for this visit.There is no height or weight on file to calculate BMI.  ?General Appearance: Casual  ?Eye Contact:  Good  ?Speech:  Normal Rate  ?Volume:  Normal  ?Mood:  Anxious and Depressed  ?Affect:  Depressed  ?Thought Process:  Coherent and Descriptions of Associations: Intact  ?Orientation:  Full (Time, Place, and Person)  ?Thought Content:  WDL and Logical  ?Suicidal Thoughts:  None  ?Homicidal Thoughts:  No  ?Memory:  Immediate;   Good ?Recent;   Good ?Remote;   Good  ?Judgement:  Good  ?Insight:  Good  ?Psychomotor Activity:  WDL  ?Concentration:  Concentration: Good and Attention Span: Good  ?Recall:  Good  ?Fund of Knowledge:Good  ?Language: Good  ?Akathisia:  No  ?Handed:  Right  ?AIMS (if indicated):  not done  ?Assets:  Housing ?Leisure Time ?Physical Health ?Resilience ?Social Support ?Vocational/Educational  ?ADL's:  Intact  ?Cognition: WNL  ?Sleep:  Good  ? ?Screenings: ?PHQ2-9   ? ?[place] Office Visit from 07/01/2021 in [facility] Office Visit from 06/07/2020 in [facility]  ?PHQ-2 Total Score 4 6  ?PHQ-9 Total Score 16 19  ? ?  ? ?Flowsheet Row Office Visit from 07/01/2021 in [facility]  ?C-SSRS RISK CATEGORY Low Risk  ? ?  ? ? ?Assessment and Plan:  ?Major depressive disorder, recurrent, moderate: ?-Continue therapy ?-Continue Lexapro 10 mg daily ? ?Anxiety: ?-Continue hydroxyzine 10 mg TID  PRN anxiety ?-Follow up in 3 months ? ?Insomnia: ?-Continue hydroxyzine 30 mg daily at bedtime PRN or ?Started Trazodone 25-50 mg daily PRN sleep ? ?Virtual Visit via Video Note ? ?I connected with Silvana Newness on 01/07/22 at  4:30 PM EDT by a video enabled telemedicine application and verified that I am speaking with the correct person using two identifiers. ? ?Location: ?Patient: home ?Provider: home office ?  ?I discussed the limitations of evaluation and management by telemedicine and the availability of in person appointments. The patient expressed understanding and agreed to proceed. ? ?Follow Up Instructions: ?Follow up in 3 months ?  ?I discussed the assessment and treatment plan with the patient. The patient was provided an opportunity  to ask questions and all were answered. The patient agreed with the plan and demonstrated an understanding of the instructions. ?  ?The patient was advised to call back or seek an in-person evaluation if the symptoms worsen or if the condition fails to improve as anticipated. ? ?I provided 10 minutes of non-face-to-face time during this encounter. ? ? ?Nanine Means, NP  ? ? ?Nanine Means, NP ?4/18/20234:51 PM ? ?

## 2022-04-01 ENCOUNTER — Telehealth (HOSPITAL_COMMUNITY): Payer: No Payment, Other | Admitting: Psychiatry

## 2022-04-15 ENCOUNTER — Telehealth (INDEPENDENT_AMBULATORY_CARE_PROVIDER_SITE_OTHER): Payer: No Payment, Other | Admitting: Psychiatry

## 2022-04-15 ENCOUNTER — Encounter (HOSPITAL_COMMUNITY): Payer: Self-pay | Admitting: Psychiatry

## 2022-04-15 DIAGNOSIS — F331 Major depressive disorder, recurrent, moderate: Secondary | ICD-10-CM

## 2022-04-15 MED ORDER — TRAZODONE HCL 50 MG PO TABS: 50.0000 mg | ORAL_TABLET | Freq: Every day | ORAL | 2 refills | Status: DC

## 2022-04-15 MED ORDER — ESCITALOPRAM OXALATE 20 MG PO TABS
20.0000 mg | ORAL_TABLET | Freq: Every day | ORAL | 0 refills | Status: DC
Start: 2022-04-15 — End: 2022-08-29

## 2022-04-15 MED ORDER — HYDROXYZINE HCL 10 MG PO TABS: 10.0000 mg | ORAL_TABLET | Freq: Three times a day (TID) | ORAL | 3 refills | Status: DC | PRN

## 2022-04-15 NOTE — Progress Notes (Signed)
[facility] NP OP Progress Notes   Patient Identification: Casey Richmond MRN:  [id number] Date of Evaluation:  04/15/2022 Referral Source: PCP and [facility] Chief Complaint:   Chief Complaint   Anxiety; Depression; Follow-up    Visit Diagnosis:    ICD-10-CM   1. Major depressive disorder, recurrent episode, moderate (HCC)  F33.1       History of Present Illness:   26 year old female presents today with depression follow up.  Her depression fluctuates with an average of a 5/10 with 10 being the highest.  Passive, intermittent suicidal ideations which have increased over the past few months; feels overwhelmed at times.  Her partner is somewhat supportive.  Her anxiety has increased with moving and work.  Work is very busy this time of year which will improve in September. Sleep is "alright", appetite is "normal".  Her Lexapro increased to assist with her symptoms.  She is having urges to self-harm via cutting, has not shared with her therapist yet.  No side effects voiced and refills provided.  Past Psychiatric History: depression, anxiety  Past Medical History:  Past Medical History:  Diagnosis Date   Allergies    Anemia    Asthma    Depression    Generalized skin cysts    breasts, back, thighs, armpits   Obesity    PCOS (polycystic ovarian syndrome)    SOB (shortness of breath)     Past Surgical History:  Procedure Laterality Date   COSMETIC SURGERY     left cheek caused by a hematoma at age 15    Family Psychiatric History: father with substance abuse and possibly bipolar d/o, mother with depression and anxiety  Family History:  Family History  Problem Relation Age of Onset   Drug abuse Father    Hypertension Mother    Obesity Mother     Social History:   Social History   Socioeconomic History   Marital status: Single    Spouse name: Danae Chen   Number of children: Not on file   Years of education: Not on file   Highest education level: Not on file   Occupational History   Occupation: Administrator, [employer]  Tobacco Use   Smoking status: Never   Smokeless tobacco: Never  Substance and Sexual Activity   Alcohol use: Not Currently   Drug use: Never   Sexual activity: Yes    Birth control/protection: None  Other Topics Concern   Not on file  Social History Narrative   Not on file   Social Determinants of Health   Financial Resource Strain: Not on file  Food Insecurity: Not on file  Transportation Needs: Not on file  Physical Activity: Not on file  Stress: Not on file  Social Connections: Not on file    Additional Social History: lives with two roommates  Allergies:   Allergies  Allergen Reactions   Ethinyl Estradiol Nausea And Vomiting and Other (See Comments)   Norethindrone Other (See Comments)    Metabolic Disorder Labs: Lab Results  Component Value Date   HGBA1C 5.7 (H) 06/07/2020   No results found for: "PROLACTIN" Lab Results  Component Value Date   CHOL 142 06/07/2020   TRIG 62 06/07/2020   HDL 55 06/07/2020   CHOLHDL 2.6 06/07/2020   LDLCALC 74 06/07/2020   Lab Results  Component Value Date   TSH 1.710 06/07/2020    Therapeutic Level Labs: No results found for: "LITHIUM" No results found for: "CBMZ" No results found for: "VALPROATE"  Current Medications: Current Outpatient Medications  Medication Sig Dispense Refill   AUROVELA FE 1.5/30 1.5-30 MG-MCG tablet Take 1 tablet by mouth at bedtime.     escitalopram (LEXAPRO) 10 MG tablet Take 1 tablet (10 mg total) by mouth daily. Take 1/2 tablet daily for one week, then increase it to one tablet daily. 90 tablet 0   ferrous sulfate 325 (65 FE) MG tablet Take 1 tablet (325 mg total) by mouth daily with breakfast. 30 tablet 0   hydrOXYzine (ATARAX) 10 MG tablet Take 1 tablet (10 mg total) by mouth 3 (three) times daily as needed for anxiety. 180 tablet 3   metFORMIN (GLUCOPHAGE) 500 MG tablet Take 1 tablet (500 mg total) by mouth every morning. 30 tablet  0   traZODone (DESYREL) 50 MG tablet Take 1 tablet (50 mg total) by mouth at bedtime. 30 tablet 2   Vitamin D, Ergocalciferol, (DRISDOL) 1.25 MG (50000 UNIT) CAPS capsule Take 1 capsule (50,000 Units total) by mouth every 7 (seven) days. 4 capsule 0   No current facility-administered medications for this visit.    Musculoskeletal: Strength & Muscle Tone: within normal limits Gait & Station: normal Patient leans: N/A  Psychiatric Specialty Exam: Review of Systems  Psychiatric/Behavioral:  Positive for dysphoric mood. The patient is nervous/anxious.   All other systems reviewed and are negative.   There were no vitals taken for this visit.There is no height or weight on file to calculate BMI.  General Appearance: Casual  Eye Contact:  Good  Speech:  Normal Rate  Volume:  Normal  Mood:  Anxious and Depressed  Affect:  Depressed  Thought Process:  Coherent and Descriptions of Associations: Intact  Orientation:  Full (Time, Place, and Person)  Thought Content:  WDL and Logical  Suicidal Thoughts:  Passive, intermittent suicidal ideations  Homicidal Thoughts:  No  Memory:  Immediate;   Good Recent;   Good Remote;   Good  Judgement:  Good  Insight:  Good  Psychomotor Activity:  WDL  Concentration:  Concentration: Good and Attention Span: Good  Recall:  Good  Fund of Knowledge:Good  Language: Good  Akathisia:  No  Handed:  Right  AIMS (if indicated):  not done  Assets:  Housing Leisure Time Physical Health Resilience Social Support Vocational/Educational  ADL's:  Intact  Cognition: WNL  Sleep:  Good   Screenings: PHQ2-9    Flowsheet Row Office Visit from 07/01/2021 in [facility] Office Visit from 06/07/2020 in [facility]  PHQ-2 Total Score 4 6  PHQ-9 Total Score 16 19      Flowsheet Row Office Visit from 07/01/2021 in [facility]  C-SSRS RISK CATEGORY Low Risk       Assessment and  Plan:  Major depressive disorder, recurrent, moderate: -Continue therapy -Lexapro 10 mg daily increased to 20 mg daily  Anxiety: -Continue hydroxyzine 10 mg TID PRN anxiety -Follow up in 3 months  Insomnia: -Continue hydroxyzine 30 mg daily at bedtime PRN or Started Trazodone 25-50 mg daily PRN sleep  Virtual Visit via Video Note  I connected with Jacques Tureaud Borin on 04/15/22 at  1:30 PM EDT by a video enabled telemedicine application and verified that I am speaking with the correct person using two identifiers.  Location: Patient: home Provider: home office   I discussed the limitations of evaluation and management by telemedicine and the availability of in person appointments. The patient expressed understanding and agreed to proceed.  Follow  Up Instructions: Follow up in 3 months   I discussed the assessment and treatment plan with the patient. The patient was provided an opportunity to ask questions and all were answered. The patient agreed with the plan and demonstrated an understanding of the instructions.   The patient was advised to call back or seek an in-person evaluation if the symptoms worsen or if the condition fails to improve as anticipated.  I provided 10 minutes of non-face-to-face time during this encounter.   Nanine Means, NP    Nanine Means, NP 7/25/20231:42 PM

## 2022-04-30 ENCOUNTER — Encounter (INDEPENDENT_AMBULATORY_CARE_PROVIDER_SITE_OTHER): Payer: Self-pay

## 2022-05-03 IMAGING — DX DG CHEST 1V
1 series · 1 of 1 positions shown · non-contrast
Comparison: None.

CLINICAL DATA: Abdominal pain.

EXAM:
CHEST  1 VIEW

[chest pa]
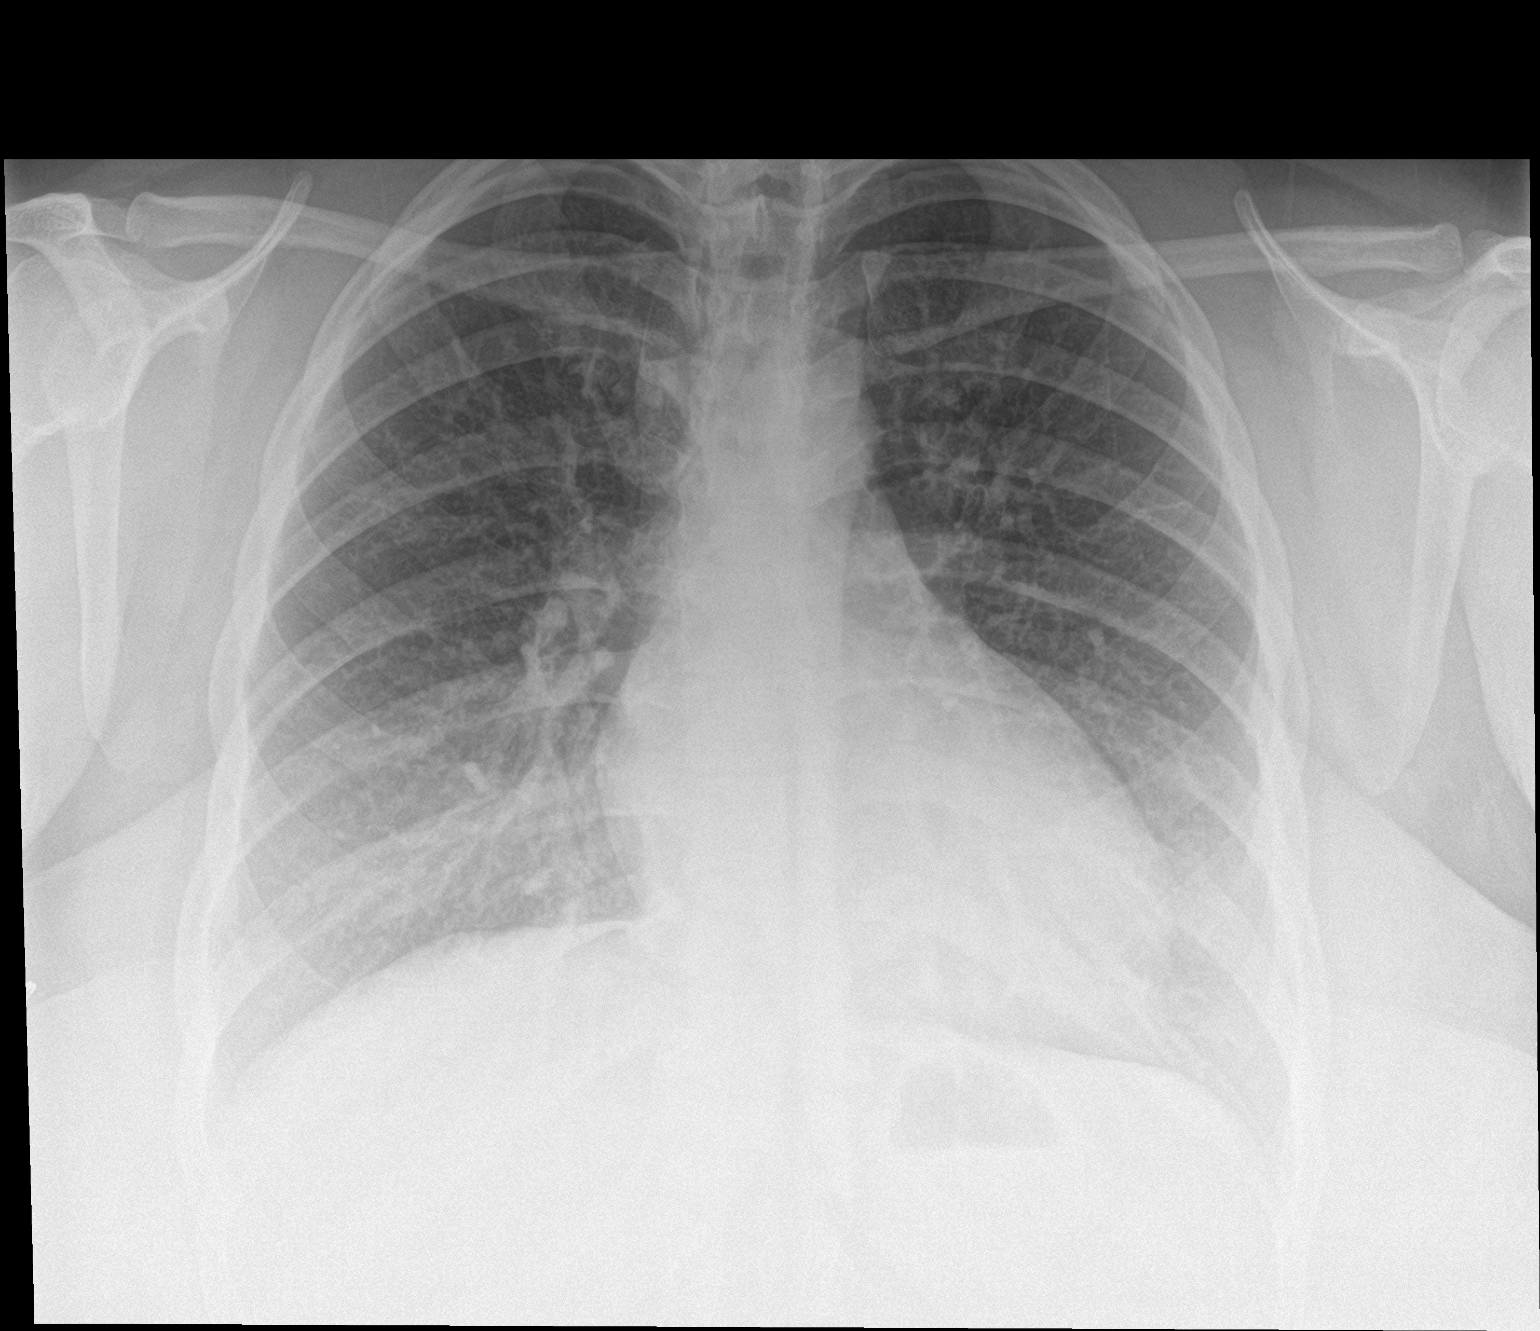

[1 of 1 positions shown; findings below may reference images not displayed]

FINDINGS: The heart size and mediastinal contours are within normal limits.
Both lungs are clear. The visualized skeletal structures are
unremarkable.
IMPRESSION: No active disease.

## 2022-05-05 IMAGING — CT CT ABD-PELV W/ CM
2 of 4 series · 17 of 46 positions shown, 19 images · IV contrast (Omnipaque)
Comparison: None.

CLINICAL DATA: Right lower quadrant abdominal pain

EXAM:
CT ABDOMEN AND PELVIS WITH CONTRAST
TECHNIQUE: Multidetector CT imaging of the abdomen and pelvis was performed
using the standard protocol following bolus administration of
intravenous contrast.
CONTRAST:  100mL OMNIPAQUE IOHEXOL 300 MG/ML  SOLN

[Series 2: axial st · axial · 0.80mm/px · z∈[-290,+130]mm · 14 of 94 slices shown, 16 images]
[im 5/94  soft-tissue]
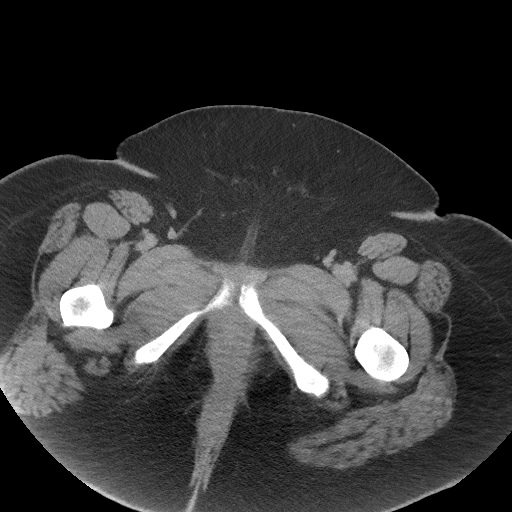
[im 5/94  bone]
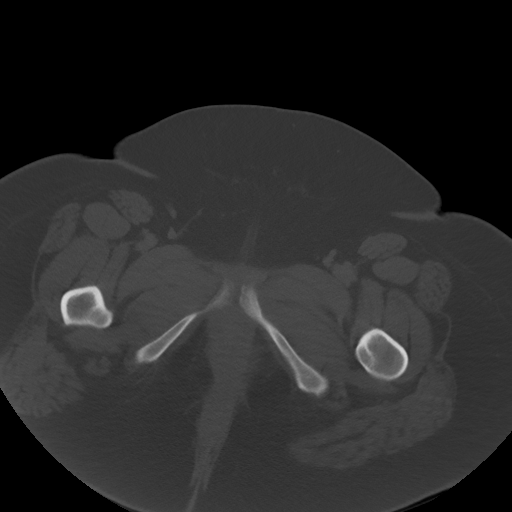
[im 13/94  soft-tissue]
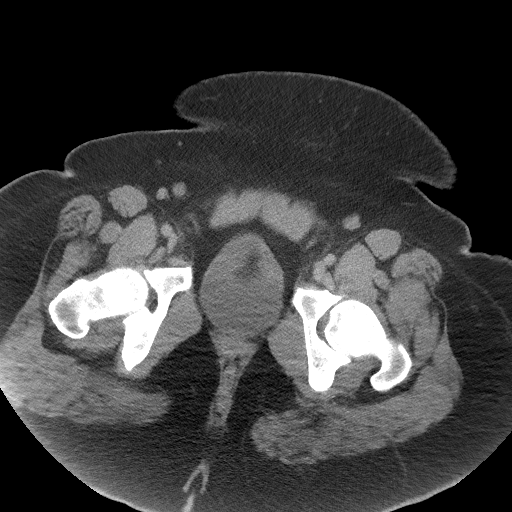
[im 17/94  soft-tissue]
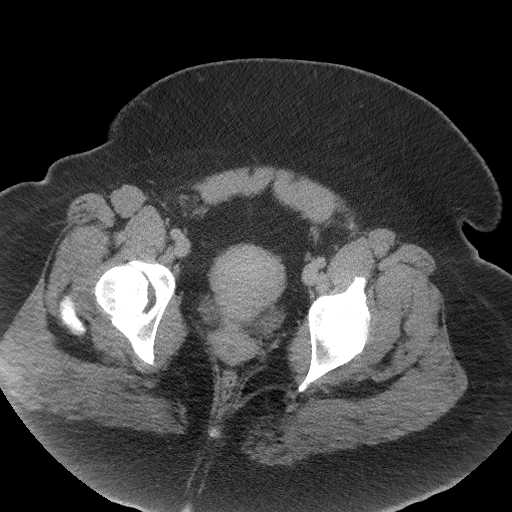
[im 25/94  soft-tissue]
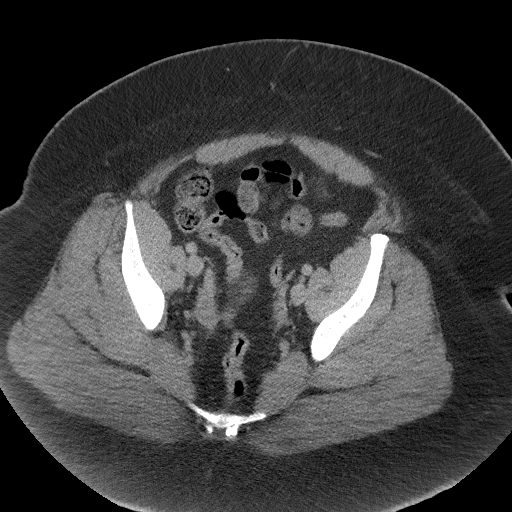
[im 33/94  soft-tissue]
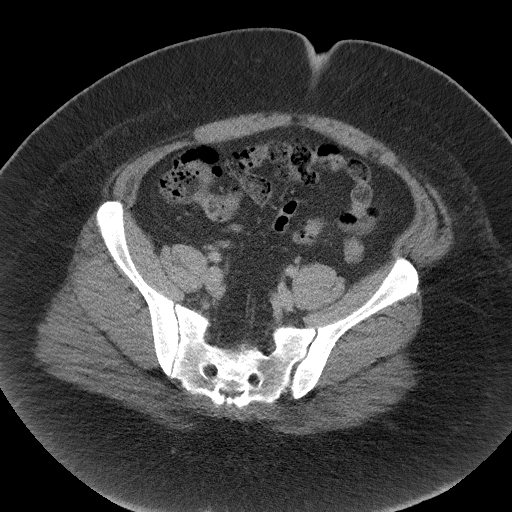
[im 37/94  soft-tissue]
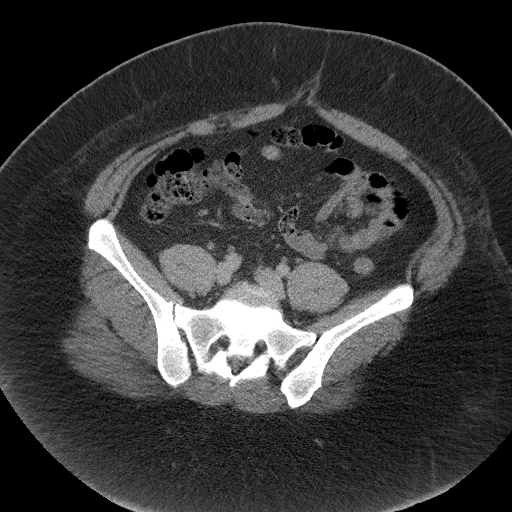
[im 45/94  soft-tissue]
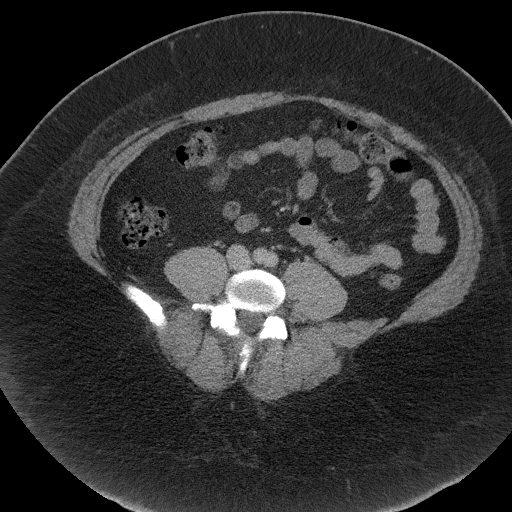
[im 49/94  soft-tissue]
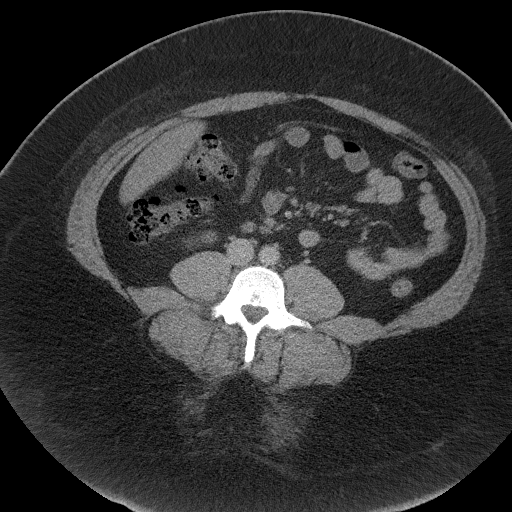
[im 57/94  soft-tissue]
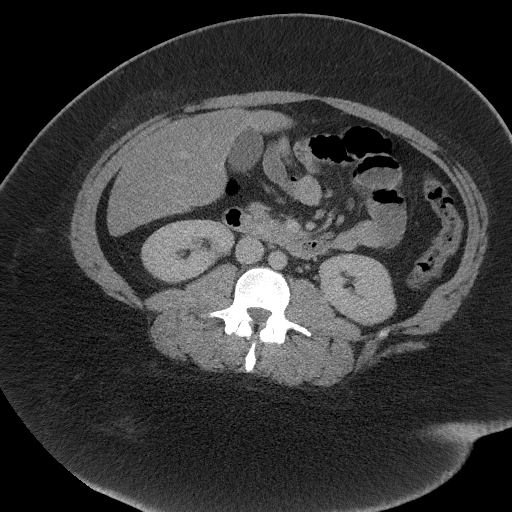
[im 57/94  bone]
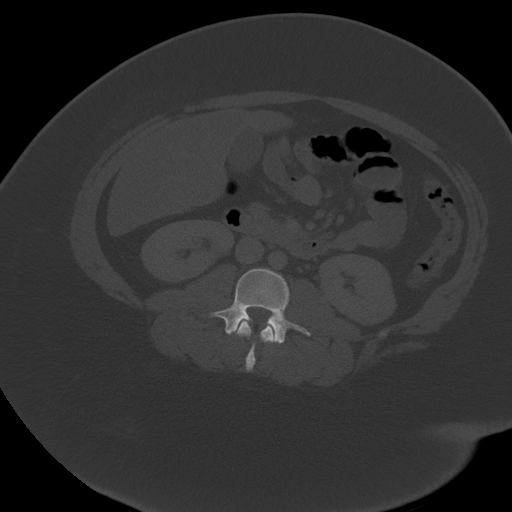
[im 61/94  soft-tissue]
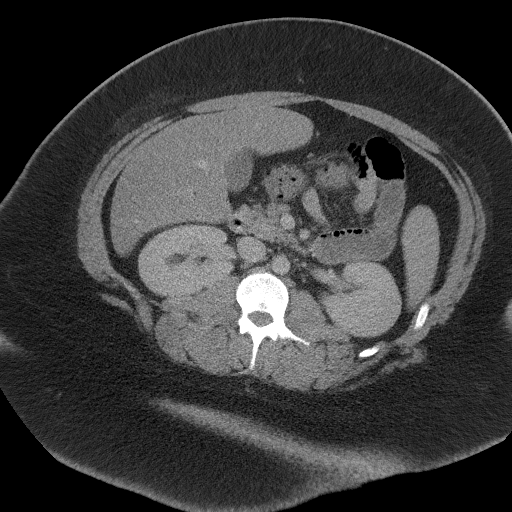
[im 69/94  soft-tissue]
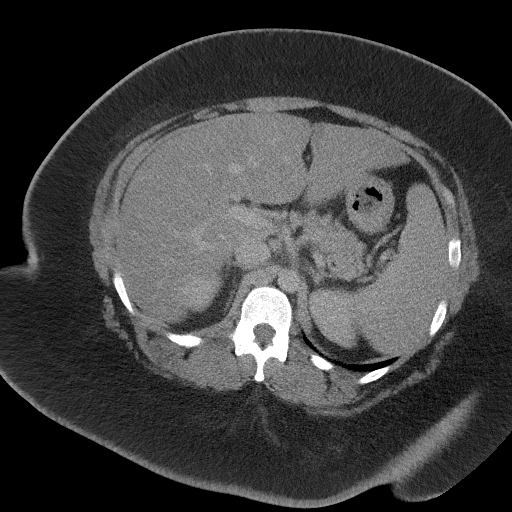
[im 77/94  soft-tissue]
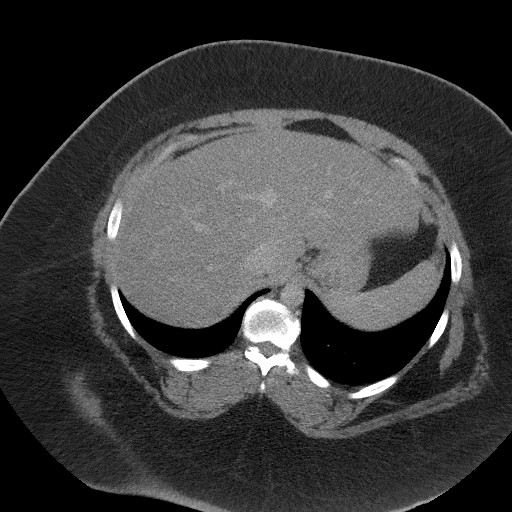
[im 81/94  soft-tissue]
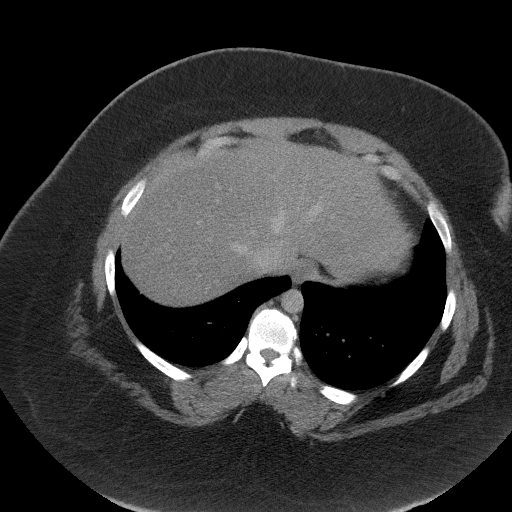
[im 89/94  soft-tissue]
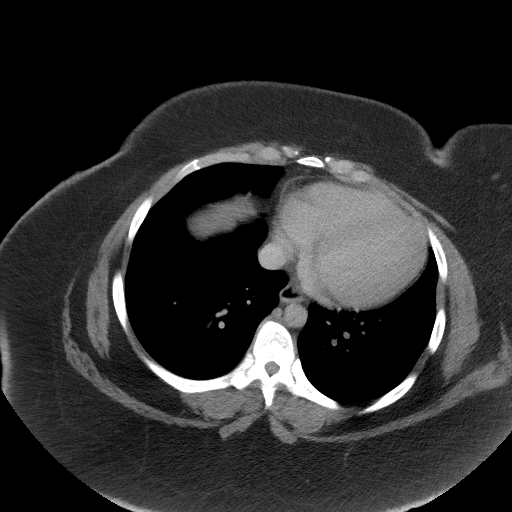

[Series 5: coronal st · coronal · 0.93mm/px · 3 of 110 slices shown]
[im 37/110  soft-tissue]
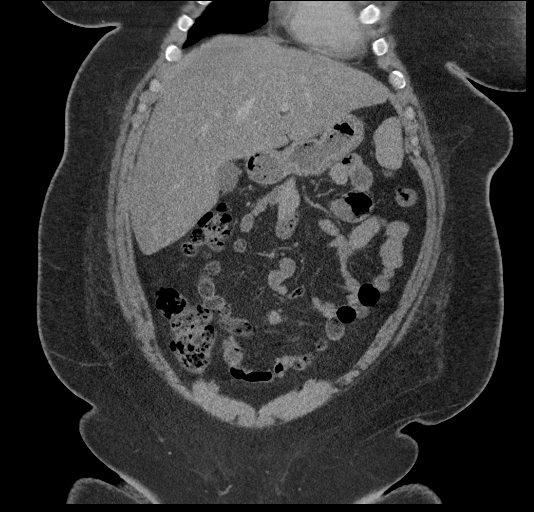
[im 49/110  soft-tissue]
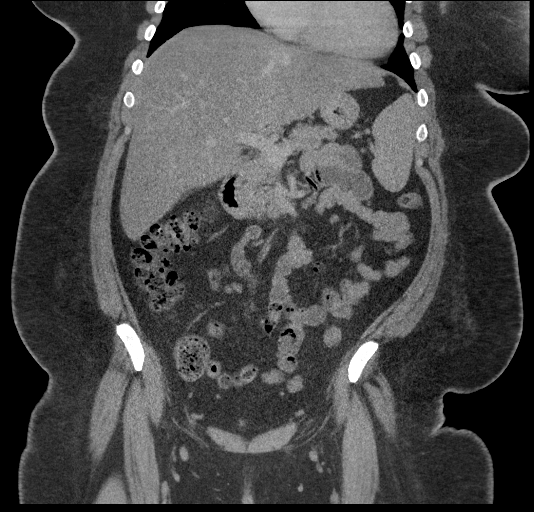
[im 61/110  soft-tissue]
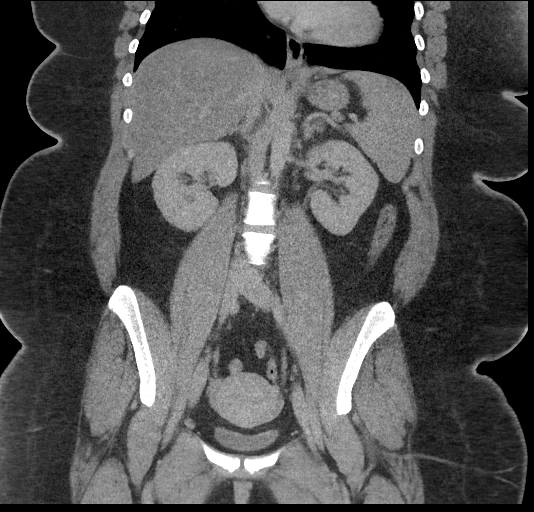

[17 of 46 positions shown; findings below may reference images not displayed]

FINDINGS: Lower chest: Lung bases are clear. No effusions. Heart is normal
size.

Hepatobiliary: Diffuse low-density throughout the liver compatible
with fatty infiltration. No focal abnormality. Gallbladder
unremarkable.

Pancreas: No focal abnormality or ductal dilatation.

Spleen: No focal abnormality.  Normal size.

Adrenals/Urinary Tract: No adrenal abnormality. No focal renal
abnormality. No stones or hydronephrosis. Urinary bladder is
unremarkable.

Stomach/Bowel: Normal appendix. Stomach, large and small bowel
grossly unremarkable.

Vascular/Lymphatic: No evidence of aneurysm or adenopathy.

Reproductive: Uterus and adnexa unremarkable.  No mass.

Other: No free fluid or free air.

Musculoskeletal: No acute bony abnormality.
IMPRESSION: Normal appendix.

Diffuse fatty infiltration of the liver.

No acute findings.

## 2022-08-29 ENCOUNTER — Ambulatory Visit (INDEPENDENT_AMBULATORY_CARE_PROVIDER_SITE_OTHER): Payer: No Payment, Other | Admitting: Psychiatry

## 2022-08-29 VITALS — BP 146/99 | HR 105

## 2022-08-29 DIAGNOSIS — F332 Major depressive disorder, recurrent severe without psychotic features: Secondary | ICD-10-CM

## 2022-08-29 MED ORDER — BUPROPION HCL ER (XL) 150 MG PO TB24: 150.0000 mg | ORAL_TABLET | ORAL | 2 refills | Status: DC
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
## 2022-09-02 DIAGNOSIS — R112 Nausea with vomiting, unspecified: Secondary | ICD-10-CM | POA: Diagnosis not present

## 2022-09-02 DIAGNOSIS — R197 Diarrhea, unspecified: Secondary | ICD-10-CM | POA: Diagnosis not present

## 2022-09-02 DIAGNOSIS — R1031 Right lower quadrant pain: Secondary | ICD-10-CM | POA: Diagnosis not present

## 2022-09-02 DIAGNOSIS — N926 Irregular menstruation, unspecified: Secondary | ICD-10-CM | POA: Diagnosis not present

## 2022-09-03 ENCOUNTER — Other Ambulatory Visit: Payer: Self-pay | Admitting: Emergency Medicine

## 2022-09-03 DIAGNOSIS — N83209 Unspecified ovarian cyst, unspecified side: Secondary | ICD-10-CM

## 2022-09-03 DIAGNOSIS — R1031 Right lower quadrant pain: Secondary | ICD-10-CM

## 2022-09-04 ENCOUNTER — Ambulatory Visit
Admission: RE | Admit: 2022-09-04 | Discharge: 2022-09-04 | Disposition: A | Discharge status: Home / Self Care | Payer: Medicaid Other | Source: Ambulatory Visit | Attending: Emergency Medicine | Admitting: Emergency Medicine

## 2022-09-04 DIAGNOSIS — Z8742 Personal history of other diseases of the female genital tract: Secondary | ICD-10-CM | POA: Diagnosis not present

## 2022-09-04 DIAGNOSIS — R1031 Right lower quadrant pain: Secondary | ICD-10-CM

## 2022-09-04 DIAGNOSIS — N83209 Unspecified ovarian cyst, unspecified side: Secondary | ICD-10-CM

## 2022-09-26 ENCOUNTER — Encounter (HOSPITAL_COMMUNITY): Payer: No Payment, Other | Admitting: Psychiatry

## 2022-10-13 ENCOUNTER — Telehealth: Payer: Self-pay

## 2022-10-13 NOTE — Telephone Encounter (Signed)
Mychart msg sent. AS, CMA 

## 2022-10-15 ENCOUNTER — Ambulatory Visit (INDEPENDENT_AMBULATORY_CARE_PROVIDER_SITE_OTHER): Payer: Self-pay | Admitting: Primary Care

## 2022-11-12 ENCOUNTER — Other Ambulatory Visit (HOSPITAL_BASED_OUTPATIENT_CLINIC_OR_DEPARTMENT_OTHER): Payer: Self-pay

## 2022-11-12 ENCOUNTER — Emergency Department (HOSPITAL_BASED_OUTPATIENT_CLINIC_OR_DEPARTMENT_OTHER)
Admission: EM | Admit: 2022-11-12 | Discharge: 2022-11-12 | Disposition: A | Discharge status: Home / Self Care | Payer: Medicaid Other | Attending: Emergency Medicine | Admitting: Emergency Medicine

## 2022-11-12 ENCOUNTER — Encounter (HOSPITAL_BASED_OUTPATIENT_CLINIC_OR_DEPARTMENT_OTHER): Payer: Self-pay | Admitting: Emergency Medicine

## 2022-11-12 ENCOUNTER — Emergency Department (HOSPITAL_BASED_OUTPATIENT_CLINIC_OR_DEPARTMENT_OTHER): Payer: Medicaid Other

## 2022-11-12 ENCOUNTER — Other Ambulatory Visit: Payer: Self-pay

## 2022-11-12 DIAGNOSIS — I160 Hypertensive urgency: Secondary | ICD-10-CM | POA: Insufficient documentation

## 2022-11-12 DIAGNOSIS — Z7984 Long term (current) use of oral hypoglycemic drugs: Secondary | ICD-10-CM | POA: Insufficient documentation

## 2022-11-12 DIAGNOSIS — D72829 Elevated white blood cell count, unspecified: Secondary | ICD-10-CM | POA: Diagnosis not present

## 2022-11-12 DIAGNOSIS — L03312 Cellulitis of back [any part except buttock]: Secondary | ICD-10-CM | POA: Insufficient documentation

## 2022-11-12 DIAGNOSIS — L02212 Cutaneous abscess of back [any part, except buttock]: Secondary | ICD-10-CM | POA: Diagnosis not present

## 2022-11-12 LAB — COMPREHENSIVE METABOLIC PANEL
ALT: 14 U/L (ref 0–44)
AST: 14 U/L — ABNORMAL LOW (ref 15–41)
Albumin: 4.2 g/dL (ref 3.5–5.0)
Alkaline Phosphatase: 46 U/L (ref 38–126)
Anion gap: 11 (ref 5–15)
BUN: 10 mg/dL (ref 6–20)
CO2: 27 mmol/L (ref 22–32)
Calcium: 9.7 mg/dL (ref 8.9–10.3)
Chloride: 102 mmol/L (ref 98–111)
Creatinine, Ser: 0.66 mg/dL (ref 0.44–1.00)
GFR, Estimated: >60 mL/min (ref >60)
Glucose, Bld: 95 mg/dL (ref 70–99)
Potassium: 3.5 mmol/L (ref 3.5–5.1)
Sodium: 140 mmol/L (ref 135–145)
Total Bilirubin: 0.5 mg/dL (ref 0.3–1.2)
Total Protein: 8.4 g/dL — ABNORMAL HIGH (ref 6.5–8.1)

## 2022-11-12 LAB — HCG, SERUM, QUALITATIVE: Preg, Serum: NEGATIVE

## 2022-11-12 LAB — CBC WITH DIFFERENTIAL/PLATELET
Abs Immature Granulocytes: 0.05 10*3/uL (ref 0.00–0.07)
Basophils Absolute: 0 10*3/uL (ref 0.0–0.1)
Basophils Relative: 0 %
Eosinophils Absolute: 0.3 10*3/uL (ref 0.0–0.5)
Eosinophils Relative: 2 %
HCT: 37.3 % (ref 36.0–46.0)
Hemoglobin: 10.6 g/dL — ABNORMAL LOW (ref 12.0–15.0)
Immature Granulocytes: 0 %
Lymphocytes Relative: 19 %
Lymphs Abs: 2.4 10*3/uL (ref 0.7–4.0)
MCH: 20.4 pg — ABNORMAL LOW (ref 26.0–34.0)
MCHC: 28.4 g/dL — ABNORMAL LOW (ref 30.0–36.0)
MCV: 71.7 fL — ABNORMAL LOW (ref 80.0–100.0)
Monocytes Absolute: 0.8 10*3/uL (ref 0.1–1.0)
Monocytes Relative: 6 %
Neutro Abs: 8.8 10*3/uL — ABNORMAL HIGH (ref 1.7–7.7)
Neutrophils Relative %: 73 %
Platelets: 379 10*3/uL (ref 150–400)
RBC: 5.2 MIL/uL — ABNORMAL HIGH (ref 3.87–5.11)
RDW: 16.9 % — ABNORMAL HIGH (ref 11.5–15.5)
WBC: 12.3 10*3/uL — ABNORMAL HIGH (ref 4.0–10.5)
nRBC: 0 % (ref 0.0–0.2)

## 2022-11-12 MED ORDER — LACTATED RINGERS IV BOLUS
1000.0000 mL | Freq: Once | INTRAVENOUS | Status: AC
  Administered 2022-11-12: 1000 mL via INTRAVENOUS

## 2022-11-12 MED ORDER — CEPHALEXIN 500 MG PO CAPS
500.0000 mg | ORAL_CAPSULE | Freq: Four times a day (QID) | ORAL | 0 refills | Status: AC
  Filled 2022-11-12: qty 28, 7d supply, fill #0

## 2022-11-12 MED ORDER — SULFAMETHOXAZOLE-TRIMETHOPRIM 800-160 MG PO TABS
1.0000 | ORAL_TABLET | Freq: Two times a day (BID) | ORAL | 0 refills | Status: AC
  Filled 2022-11-12: qty 14, 7d supply, fill #0

## 2022-11-12 MED ORDER — ACETAMINOPHEN 500 MG PO TABS
1000.0000 mg | ORAL_TABLET | Freq: Once | ORAL | Status: AC
  Administered 2022-11-12: 1000 mg via ORAL
  Filled 2022-11-12: qty 2

## 2022-11-12 MED ORDER — IOHEXOL 300 MG/ML  SOLN
100.0000 mL | Freq: Once | INTRAMUSCULAR | Status: AC | PRN
  Administered 2022-11-12: 100 mL via INTRAVENOUS

## 2022-11-12 NOTE — ED Provider Notes (Signed)
[facility] Provider Note   CSN: [id number] Arrival date & time: 11/12/22  1155     History  Chief Complaint  Patient presents with   Abscess    Casey Richmond is a 27 y.o. female.  HPI 27 year old female with a history of hidradenitis presents with concern for an abscess.  She states that for the past 8 days she has been having thoracic right-sided back pain like she is getting an abscess.  Had a fever for the first couple days though none since.  The pain seems to be worsening but she has not felt any abscess head like typical and there is been no drainage.  The area itself feels warm to her.  No trauma.  She took some ibuprofen which has partially helped.  Took that this morning a couple hours ago.  Home Medications Prior to Admission medications   Medication Sig Start Date End Date Taking? Authorizing Provider  cephALEXin (KEFLEX) 500 MG capsule Take 1 capsule (500 mg total) by mouth 4 (four) times daily for 7 days. 11/12/22 11/19/22 Yes Sherwood Gambler, MD  sulfamethoxazole-trimethoprim (BACTRIM DS) 800-160 MG tablet Take 1 tablet by mouth 2 (two) times daily for 7 days. 11/12/22 11/19/22 Yes Sherwood Gambler, MD  AUROVELA FE 1.5/30 1.5-30 MG-MCG tablet Take 1 tablet by mouth at bedtime. 05/10/20   [provider]  buPROPion (WELLBUTRIN XL) 150 MG 24 hr tablet Take 1 tablet (150 mg total) by mouth every morning. 08/29/22   Armando Reichert, MD  ferrous sulfate 325 (65 FE) MG tablet Take 1 tablet (325 mg total) by mouth daily with breakfast. 06/21/20   Dennard Nip D, MD  hydrOXYzine (ATARAX) 10 MG tablet Take 1 tablet (10 mg total) by mouth 3 (three) times daily as needed for anxiety. 04/15/22   Patrecia Pour, NP  metFORMIN (GLUCOPHAGE) 500 MG tablet Take 1 tablet (500 mg total) by mouth every morning. 06/21/20   Dennard Nip D, MD  traZODone (DESYREL) 50 MG tablet Take 1 tablet (50 mg total) by mouth at bedtime. 04/15/22   Patrecia Pour, NP  Vitamin D, Ergocalciferol, (DRISDOL) 1.25 MG (50000 UNIT) CAPS capsule Take 1 capsule (50,000 Units total) by mouth every 7 (seven) days. 06/21/20   Dennard Nip D, MD      Allergies    Ethinyl estradiol and Norethindrone    Review of Systems   Review of Systems  Respiratory:  Negative for shortness of breath.   Musculoskeletal:  Positive for back pain.  Skin:  Positive for color change.    Physical Exam Updated Vital Signs BP (!) 165/80   Pulse 80   Temp 98 F (36.7 C)   Resp 16   SpO2 99%  Physical Exam Vitals and nursing note reviewed.  Constitutional:      Appearance: She is well-developed. She is obese.  HENT:     Head: Normocephalic and atraumatic.  Cardiovascular:     Rate and Rhythm: Normal rate and regular rhythm.     Heart sounds: Normal heart sounds.  Pulmonary:     Effort: Pulmonary effort is normal.     Breath sounds: Normal breath sounds.  Musculoskeletal:       Back:  Skin:    General: Skin is warm and dry.  Neurological:     Mental Status: She is alert.     ED Results / Procedures / Treatments   Labs (all labs ordered are listed, but only abnormal results  are displayed) Labs Reviewed  CBC WITH DIFFERENTIAL/PLATELET - Abnormal; Notable for the following components:      Result Value   WBC 12.3 (*)    RBC 5.20 (*)    Hemoglobin 10.6 (*)    MCV 71.7 (*)    MCH 20.4 (*)    MCHC 28.4 (*)    RDW 16.9 (*)    Neutro Abs 8.8 (*)    All other components within normal limits  COMPREHENSIVE METABOLIC PANEL - Abnormal; Notable for the following components:   Total Protein 8.4 (*)    AST 14 (*)    All other components within normal limits  HCG, SERUM, QUALITATIVE    EKG None  Radiology CT ABDOMEN PELVIS W CONTRAST  Result Date: 11/12/2022 CLINICAL DATA:  Abscess on RIGHT side of back EXAM: CT ABDOMEN AND PELVIS WITH CONTRAST TECHNIQUE: Multidetector CT imaging of the abdomen and pelvis was performed using the standard protocol  following bolus administration of intravenous contrast. RADIATION DOSE REDUCTION: This exam was performed according to the departmental dose-optimization program which includes automated exposure control, adjustment of the mA and/or kV according to patient size and/or use of iterative reconstruction technique. CONTRAST:  158m OMNIPAQUE IOHEXOL 300 MG/ML SOLN IV. No oral contrast. COMPARISON:  05/04/2020 FINDINGS: Lower chest: Lung bases clear Hepatobiliary: Fatty infiltration of liver with focal sparing adjacent to gallbladder fossa. Gallbladder and liver otherwise normal appearance. Pancreas: Normal appearance Spleen: Normal appearance Adrenals/Urinary Tract: Adrenal glands, kidneys, ureters, and bladder normal appearance Stomach/Bowel: Normal appendix. Stomach and bowel loops normal appearance Vascular/Lymphatic: Vascular structures patent.  No adenopathy. Reproductive: Unremarkable uterus and ovaries Other: No free air or free fluid. No definite hernia. Skin thickening and subcutaneous infiltration/edema identified at the site of the marker placed at the location of the patient's palpable concern/symptoms consistent with cellulitis. No discrete abscess collection at this time. This is best demonstrated on coronal images 10-26 and sagittal images 39-64. Musculoskeletal: Unremarkable IMPRESSION: Skin thickening and subcutaneous infiltration/edema at the site of the marker placed at the location of the patient's palpable concern/symptoms consistent with cellulitis. No discrete abscess collection at this time. Fatty infiltration of liver with focal sparing adjacent to gallbladder fossa. No acute intra-abdominal or intrapelvic abnormalities. Electronically Signed   By: MLavonia DanaM.D.   On: 11/12/2022 14:51    Procedures Ultrasound ED Soft Tissue  Date/Time: 11/12/2022 1:16 PM  Performed by: GSherwood Gambler MD Authorized by: GSherwood Gambler MD   Procedure details:    Indications: localization of abscess  and evaluate for cellulitis     Transverse view:  Visualized   Longitudinal view:  Visualized   Images: archived     Limitations:  Body habitus Location:    Location: upper back     Side:  Right Findings:     cellulitis present Comments:     There appears at least be cellulitis on the ultrasound though there is a significant limitation due to obesity and redundant tissue.  No clear abscess, at least superficially.     Medications Ordered in ED Medications  lactated ringers bolus 1,000 mL (0 mLs Intravenous Stopped 11/12/22 1428)  acetaminophen (TYLENOL) tablet 1,000 mg (1,000 mg Oral Given 11/12/22 1325)  iohexol (OMNIPAQUE) 300 MG/ML solution 100 mL (100 mLs Intravenous Contrast Given 11/12/22 1430)    ED Course/ Medical Decision Making/ A&P  Medical Decision Making Amount and/or Complexity of Data Reviewed Labs: ordered.    Details: Mild leukocytosis of 12.  No significant hyperglycemia or metabolic disturbance. Radiology: ordered and independent interpretation performed.    Details: CT shows what is likely cellulitis but no obvious fluid collection.  Risk OTC drugs. Prescription drug management.   Patient is well-appearing.  She was tachycardic when she first got here though this has improved just with rest.  She was also quite hypertensive though this is improved but is still pretty hypertensive.  Overall, I decided to do a CT as with her pannus on her back it is difficult to get a good assessment of this cellulitis.  Unable to see a clear fluid collection on ultrasound but a CT was obtained and does not show any obvious abscess but likely cellulitis.  Given I can see nothing on the ultrasound either, I think the likelihood of fluid being there that we cannot see is pretty low.  Discussed this with patient and we decided to start on antibiotics and have her return if symptoms do not improve or worsen.  Otherwise, we also discussed that her blood  pressure is elevated today.  We discussed options and for now she would like to get this rechecked by PCP.  Will give return precautions but she otherwise does not appear septic or ill and is stable for outpatient management.        Final Clinical Impression(s) / ED Diagnoses Final diagnoses:  Cellulitis of back  Asymptomatic hypertensive urgency    Rx / DC Orders ED Discharge Orders          Ordered    sulfamethoxazole-trimethoprim (BACTRIM DS) 800-160 MG tablet  2 times daily        11/12/22 1504    cephALEXin (KEFLEX) 500 MG capsule  4 times daily        11/12/22 1504              Sherwood Gambler, MD 11/12/22 1521

## 2022-11-12 NOTE — Discharge Instructions (Addendum)
You are being given 2 different antibiotics to help treat the skin infection of your back.  If you feel like the infection is not getting better over the next 48 hours or if at any point it seems to worsen in any way the news to return to the ER or call 911.  Your blood pressure is also elevated today.  What it has improved since when you first got here, it is still pretty elevated.  It is unclear if this is due to being in the ER, due to your infection and pain, or due to high blood pressure is a chronic condition.  You should follow-up with a primary care physician to get this rechecked as soon as possible.

## 2022-11-12 NOTE — ED Triage Notes (Signed)
Pt reports abscess on the left side of her back. Pt reports fever at home. Denies N/V.

## 2022-12-19 DIAGNOSIS — L03312 Cellulitis of back [any part except buttock]: Secondary | ICD-10-CM | POA: Diagnosis not present

## 2022-12-19 DIAGNOSIS — B372 Candidiasis of skin and nail: Secondary | ICD-10-CM | POA: Diagnosis not present

## 2022-12-25 DIAGNOSIS — L732 Hidradenitis suppurativa: Secondary | ICD-10-CM | POA: Diagnosis not present

## 2022-12-25 DIAGNOSIS — R509 Fever, unspecified: Secondary | ICD-10-CM | POA: Diagnosis not present

## 2022-12-25 DIAGNOSIS — Z3041 Encounter for surveillance of contraceptive pills: Secondary | ICD-10-CM | POA: Diagnosis not present

## 2022-12-25 DIAGNOSIS — Z113 Encounter for screening for infections with a predominantly sexual mode of transmission: Secondary | ICD-10-CM | POA: Diagnosis not present

## 2022-12-25 DIAGNOSIS — R61 Generalized hyperhidrosis: Secondary | ICD-10-CM | POA: Diagnosis not present

## 2022-12-29 DIAGNOSIS — J309 Allergic rhinitis, unspecified: Secondary | ICD-10-CM | POA: Diagnosis not present

## 2022-12-29 DIAGNOSIS — J45901 Unspecified asthma with (acute) exacerbation: Secondary | ICD-10-CM | POA: Diagnosis not present

## 2022-12-29 DIAGNOSIS — R0600 Dyspnea, unspecified: Secondary | ICD-10-CM | POA: Diagnosis not present

## 2023-01-08 DIAGNOSIS — L0291 Cutaneous abscess, unspecified: Secondary | ICD-10-CM | POA: Diagnosis not present

## 2023-01-08 DIAGNOSIS — N920 Excessive and frequent menstruation with regular cycle: Secondary | ICD-10-CM | POA: Diagnosis not present

## 2023-01-08 DIAGNOSIS — J4530 Mild persistent asthma, uncomplicated: Secondary | ICD-10-CM | POA: Diagnosis not present

## 2023-01-08 DIAGNOSIS — L732 Hidradenitis suppurativa: Secondary | ICD-10-CM | POA: Diagnosis not present

## 2023-01-08 DIAGNOSIS — D649 Anemia, unspecified: Secondary | ICD-10-CM | POA: Diagnosis not present

## 2023-01-08 DIAGNOSIS — Z124 Encounter for screening for malignant neoplasm of cervix: Secondary | ICD-10-CM | POA: Diagnosis not present

## 2023-01-21 DIAGNOSIS — D72829 Elevated white blood cell count, unspecified: Secondary | ICD-10-CM | POA: Diagnosis not present

## 2023-01-21 DIAGNOSIS — L819 Disorder of pigmentation, unspecified: Secondary | ICD-10-CM | POA: Diagnosis not present

## 2023-01-21 DIAGNOSIS — D508 Other iron deficiency anemias: Secondary | ICD-10-CM | POA: Diagnosis not present

## 2023-01-21 DIAGNOSIS — R509 Fever, unspecified: Secondary | ICD-10-CM | POA: Diagnosis not present

## 2023-01-21 DIAGNOSIS — R61 Generalized hyperhidrosis: Secondary | ICD-10-CM | POA: Diagnosis not present

## 2023-01-21 DIAGNOSIS — L209 Atopic dermatitis, unspecified: Secondary | ICD-10-CM | POA: Diagnosis not present

## 2023-01-21 DIAGNOSIS — L905 Scar conditions and fibrosis of skin: Secondary | ICD-10-CM | POA: Diagnosis not present

## 2023-01-21 DIAGNOSIS — L732 Hidradenitis suppurativa: Secondary | ICD-10-CM | POA: Diagnosis not present

## 2023-01-21 DIAGNOSIS — L700 Acne vulgaris: Secondary | ICD-10-CM | POA: Diagnosis not present

## 2023-01-21 DIAGNOSIS — D649 Anemia, unspecified: Secondary | ICD-10-CM | POA: Diagnosis not present

## 2023-01-23 DIAGNOSIS — D72829 Elevated white blood cell count, unspecified: Secondary | ICD-10-CM | POA: Diagnosis not present

## 2023-01-23 DIAGNOSIS — K760 Fatty (change of) liver, not elsewhere classified: Secondary | ICD-10-CM | POA: Diagnosis not present

## 2023-01-23 DIAGNOSIS — R509 Fever, unspecified: Secondary | ICD-10-CM | POA: Diagnosis not present

## 2023-01-23 DIAGNOSIS — R61 Generalized hyperhidrosis: Secondary | ICD-10-CM | POA: Diagnosis not present

## 2023-01-28 DIAGNOSIS — D72829 Elevated white blood cell count, unspecified: Secondary | ICD-10-CM | POA: Diagnosis not present

## 2023-01-28 DIAGNOSIS — D508 Other iron deficiency anemias: Secondary | ICD-10-CM | POA: Diagnosis not present

## 2023-01-29 DIAGNOSIS — L720 Epidermal cyst: Secondary | ICD-10-CM | POA: Diagnosis not present

## 2023-01-30 DIAGNOSIS — L02212 Cutaneous abscess of back [any part, except buttock]: Secondary | ICD-10-CM | POA: Diagnosis not present

## 2023-01-30 DIAGNOSIS — L720 Epidermal cyst: Secondary | ICD-10-CM | POA: Diagnosis not present

## 2023-03-18 DIAGNOSIS — L732 Hidradenitis suppurativa: Secondary | ICD-10-CM | POA: Diagnosis not present

## 2023-03-18 DIAGNOSIS — L700 Acne vulgaris: Secondary | ICD-10-CM | POA: Diagnosis not present

## 2023-03-20 DIAGNOSIS — D508 Other iron deficiency anemias: Secondary | ICD-10-CM | POA: Diagnosis not present

## 2023-03-25 DIAGNOSIS — D72829 Elevated white blood cell count, unspecified: Secondary | ICD-10-CM | POA: Diagnosis not present

## 2023-03-25 DIAGNOSIS — D508 Other iron deficiency anemias: Secondary | ICD-10-CM | POA: Diagnosis not present

## 2023-03-25 DIAGNOSIS — R61 Generalized hyperhidrosis: Secondary | ICD-10-CM | POA: Diagnosis not present

## 2023-03-25 DIAGNOSIS — R509 Fever, unspecified: Secondary | ICD-10-CM | POA: Diagnosis not present

## 2023-04-16 ENCOUNTER — Other Ambulatory Visit (HOSPITAL_COMMUNITY)
Admission: RE | Admit: 2023-04-16 | Discharge: 2023-04-16 | Disposition: A | Discharge status: Home / Self Care | Payer: Medicaid Other | Source: Ambulatory Visit | Attending: Obstetrics and Gynecology | Admitting: Obstetrics and Gynecology

## 2023-04-16 ENCOUNTER — Encounter: Payer: Self-pay | Admitting: Obstetrics and Gynecology

## 2023-04-16 ENCOUNTER — Ambulatory Visit: Payer: Medicaid Other | Admitting: Obstetrics and Gynecology

## 2023-04-16 VITALS — BP 163/92 | HR 87 | Ht 63.0 in | Wt 362.3 lb

## 2023-04-16 DIAGNOSIS — N898 Other specified noninflammatory disorders of vagina: Secondary | ICD-10-CM

## 2023-04-16 DIAGNOSIS — Z124 Encounter for screening for malignant neoplasm of cervix: Secondary | ICD-10-CM

## 2023-04-16 DIAGNOSIS — Z1339 Encounter for screening examination for other mental health and behavioral disorders: Secondary | ICD-10-CM

## 2023-04-16 DIAGNOSIS — Z01419 Encounter for gynecological examination (general) (routine) without abnormal findings: Secondary | ICD-10-CM

## 2023-04-16 DIAGNOSIS — E282 Polycystic ovarian syndrome: Secondary | ICD-10-CM

## 2023-04-16 MED ORDER — NORETHIN ACE-ETH ESTRAD-FE 1.5-30 MG-MCG PO TABS: 1.0000 | ORAL_TABLET | Freq: Every day | ORAL | 11 refills | Status: DC

## 2023-04-16 NOTE — Progress Notes (Signed)
Patient name: Casey Richmond MRN [id number]  Date of birth: [birth year] Chief Complaint:   New patient  History of Present Illness:   Casey Richmond is a 27 y.o. G0P0000  female being seen today for a new patient, annual exam. Current complaints: complaints of vaginal itching, has had reoccurence for years, treated for yeast in past. Hx of pcos previously on metformin, and oral contraceptives. Was previously on COC and was having heavy cycles. Recently switched to a higher dose of COC and has been helping better regulate cycles.   No LMP recorded. (Menstrual status: Oral contraceptives).    The pregnancy intention screening data noted above was reviewed. Potential methods of contraception were discussed. The patient elected to proceed with COC      04/16/2023   11:08 AM 08/29/2022    2:51 PM 07/01/2021    2:07 PM 06/07/2020    9:48 AM  Depression screen PHQ 2/9  Decreased Interest 2   3  Down, Depressed, Hopeless 3   3  PHQ - 2 Score 5   6  Altered sleeping 1   1  Tired, decreased energy 1   2  Change in appetite 1   3  Feeling bad or failure about yourself  3   3  Trouble concentrating 2   1  Moving slowly or fidgety/restless 1   0  Suicidal thoughts 1   3  PHQ-9 Score 15   19  Difficult doing work/chores Somewhat difficult   Very difficult     Information is confidential and restricted. Go to Review Flowsheets to unlock data.        04/16/2023   11:11 AM  GAD 7 : Generalized Anxiety Score  Nervous, Anxious, on Edge 1  Control/stop worrying 2  Worry too much - different things 3  Trouble relaxing 1  Restless 1  Easily annoyed or irritable 1  Afraid - awful might happen 0  Total GAD 7 Score 9  Anxiety Difficulty Somewhat difficult     Review of Systems:   Pertinent items are noted in HPI Denies any headaches, blurred vision, fatigue, shortness of breath, chest pain, abdominal pain, abnormal vaginal discharge/itching/odor/irritation, problems with  periods, bowel movements, urination, or intercourse unless otherwise stated above. Pertinent History Reviewed:  Reviewed past medical,surgical, social and family history.  Reviewed problem list, medications and allergies. Physical Assessment:   Vitals:   04/16/23 1103  BP: (!) 163/92  Pulse: 87  Weight: (!) 362 lb 4.8 oz (164.3 kg)  Height: 5\' 3"  (1.6 m)  Body mass index is 64.18 kg/m.        Physical Examination:   General appearance - well appearing, and in no distress  Mental status - alert, oriented to person, place, and time  Psych:  She has a normal mood and affect  Skin - warm and dry, normal color, no suspicious lesions noted  Chest - effort normal, all lung fields clear to auscultation bilaterally  Heart - normal rate and regular rhythm  Neck:  midline trachea, no thyromegaly or nodules  Breasts - breasts appear normal, no suspicious masses, no skin or nipple changes or  axillary nodes  Abdomen - soft, nontender, nondistended, no masses or organomegaly  Pelvic - VULVA: normal appearing vulva with no masses, tenderness or lesions  VAGINA: normal appearing vagina with normal color and discharge, no lesions  CERVIX: normal appearing cervix without discharge or lesions, no CMT  Thin prep pap is done    UTERUS:  uterus is felt to be normal size, shape, consistency and nontender   ADNEXA: No adnexal masses or tenderness noted.  Extremities:  No swelling or varicosities noted  Chaperone present for exam  No results found for this or any previous visit (from the past 24 hour(s)).  Assessment & Plan:  1. Well woman exam with routine gynecological exam Discussed monthly self breast exams Discussed elevated PHQ9, and SI, denies plan. Offered referral to counselor, declines at this time. Ecnouraged to reach if needed, Information provided on 24/7 behavioral health urgent care  2. Cervical cancer screening  - Cytology - PAP( [facility])  3. Vaginal itching Swab today,  discussed tx options as well as preventative measures at home  - Cervicovaginal ancillary only  4. PCOS (polycystic ovarian syndrome) Doing well on current OCP, would like to continue current regimen. Refills sent  - norethindrone-ethinyl estradiol-iron (JUNEL FE 1.5/30) 1.5-30 MG-MCG tablet; Take 1 tablet by mouth daily.  Dispense: 28 tablet; Refill: 11   Labs/procedures today:   Mammogram: @ 27yo, or sooner if problems Colonoscopy: @ 27yo, or sooner if problems  Follow-up: Return in one year for routine annual well woman exam  Albertine Grates, FNP

## 2023-04-16 NOTE — Progress Notes (Signed)
NGYN presents for irregular periods. Pt reports PCOS. Last PAP 3-4 years ago. Pt requesting PAP, declines STD testing. Pt c/o vaginal itch.

## 2023-04-17 DIAGNOSIS — Z124 Encounter for screening for malignant neoplasm of cervix: Secondary | ICD-10-CM | POA: Diagnosis not present

## 2023-04-17 DIAGNOSIS — N898 Other specified noninflammatory disorders of vagina: Secondary | ICD-10-CM | POA: Diagnosis not present

## 2023-06-08 DIAGNOSIS — R0981 Nasal congestion: Secondary | ICD-10-CM | POA: Diagnosis not present

## 2023-06-08 DIAGNOSIS — R6883 Chills (without fever): Secondary | ICD-10-CM | POA: Diagnosis not present

## 2023-06-08 DIAGNOSIS — R0600 Dyspnea, unspecified: Secondary | ICD-10-CM | POA: Diagnosis not present

## 2023-06-08 DIAGNOSIS — J029 Acute pharyngitis, unspecified: Secondary | ICD-10-CM | POA: Diagnosis not present

## 2023-06-08 DIAGNOSIS — H9203 Otalgia, bilateral: Secondary | ICD-10-CM | POA: Diagnosis not present

## 2023-06-22 DIAGNOSIS — L905 Scar conditions and fibrosis of skin: Secondary | ICD-10-CM | POA: Diagnosis not present

## 2023-06-22 DIAGNOSIS — L732 Hidradenitis suppurativa: Secondary | ICD-10-CM | POA: Diagnosis not present

## 2023-06-22 DIAGNOSIS — L819 Disorder of pigmentation, unspecified: Secondary | ICD-10-CM | POA: Diagnosis not present

## 2023-06-22 DIAGNOSIS — L700 Acne vulgaris: Secondary | ICD-10-CM | POA: Diagnosis not present

## 2023-06-24 DIAGNOSIS — F331 Major depressive disorder, recurrent, moderate: Secondary | ICD-10-CM | POA: Diagnosis not present

## 2023-06-29 DIAGNOSIS — F331 Major depressive disorder, recurrent, moderate: Secondary | ICD-10-CM | POA: Diagnosis not present

## 2023-07-08 DIAGNOSIS — F331 Major depressive disorder, recurrent, moderate: Secondary | ICD-10-CM | POA: Diagnosis not present

## 2023-07-10 DIAGNOSIS — D508 Other iron deficiency anemias: Secondary | ICD-10-CM | POA: Diagnosis not present

## 2023-07-13 DIAGNOSIS — D500 Iron deficiency anemia secondary to blood loss (chronic): Secondary | ICD-10-CM | POA: Diagnosis not present

## 2023-07-13 DIAGNOSIS — F331 Major depressive disorder, recurrent, moderate: Secondary | ICD-10-CM | POA: Diagnosis not present

## 2023-07-14 DIAGNOSIS — F331 Major depressive disorder, recurrent, moderate: Secondary | ICD-10-CM | POA: Diagnosis not present

## 2023-07-21 DIAGNOSIS — F331 Major depressive disorder, recurrent, moderate: Secondary | ICD-10-CM | POA: Diagnosis not present

## 2023-07-27 DIAGNOSIS — F331 Major depressive disorder, recurrent, moderate: Secondary | ICD-10-CM | POA: Diagnosis not present

## 2023-08-10 DIAGNOSIS — F331 Major depressive disorder, recurrent, moderate: Secondary | ICD-10-CM | POA: Diagnosis not present

## 2023-08-17 DIAGNOSIS — Z0001 Encounter for general adult medical examination with abnormal findings: Secondary | ICD-10-CM | POA: Diagnosis not present

## 2023-08-17 DIAGNOSIS — Z0000 Encounter for general adult medical examination without abnormal findings: Secondary | ICD-10-CM | POA: Diagnosis not present

## 2023-08-18 DIAGNOSIS — F331 Major depressive disorder, recurrent, moderate: Secondary | ICD-10-CM | POA: Diagnosis not present

## 2023-09-23 DIAGNOSIS — F331 Major depressive disorder, recurrent, moderate: Secondary | ICD-10-CM | POA: Diagnosis not present

## 2023-10-06 DIAGNOSIS — F331 Major depressive disorder, recurrent, moderate: Secondary | ICD-10-CM | POA: Diagnosis not present

## 2023-10-13 DIAGNOSIS — F331 Major depressive disorder, recurrent, moderate: Secondary | ICD-10-CM | POA: Diagnosis not present

## 2023-10-23 DIAGNOSIS — F331 Major depressive disorder, recurrent, moderate: Secondary | ICD-10-CM | POA: Diagnosis not present

## 2023-10-28 DIAGNOSIS — F331 Major depressive disorder, recurrent, moderate: Secondary | ICD-10-CM | POA: Diagnosis not present

## 2023-11-03 DIAGNOSIS — F331 Major depressive disorder, recurrent, moderate: Secondary | ICD-10-CM | POA: Diagnosis not present

## 2023-11-12 DIAGNOSIS — R051 Acute cough: Secondary | ICD-10-CM | POA: Diagnosis not present

## 2023-11-12 DIAGNOSIS — R519 Headache, unspecified: Secondary | ICD-10-CM | POA: Diagnosis not present

## 2023-11-12 DIAGNOSIS — R0981 Nasal congestion: Secondary | ICD-10-CM | POA: Diagnosis not present

## 2024-01-01 DIAGNOSIS — J45901 Unspecified asthma with (acute) exacerbation: Secondary | ICD-10-CM | POA: Diagnosis not present

## 2024-01-01 DIAGNOSIS — R0600 Dyspnea, unspecified: Secondary | ICD-10-CM | POA: Diagnosis not present

## 2024-01-01 DIAGNOSIS — R6883 Chills (without fever): Secondary | ICD-10-CM | POA: Diagnosis not present

## 2024-01-01 DIAGNOSIS — R0981 Nasal congestion: Secondary | ICD-10-CM | POA: Diagnosis not present

## 2024-01-01 DIAGNOSIS — R051 Acute cough: Secondary | ICD-10-CM | POA: Diagnosis not present

## 2024-01-03 ENCOUNTER — Other Ambulatory Visit: Payer: Self-pay

## 2024-01-03 ENCOUNTER — Emergency Department (HOSPITAL_BASED_OUTPATIENT_CLINIC_OR_DEPARTMENT_OTHER)
Admission: EM | Admit: 2024-01-03 | Discharge: 2024-01-03 | Disposition: A | Discharge status: Home / Self Care | Attending: Emergency Medicine | Admitting: Emergency Medicine

## 2024-01-03 ENCOUNTER — Emergency Department (HOSPITAL_BASED_OUTPATIENT_CLINIC_OR_DEPARTMENT_OTHER): Admitting: Radiology

## 2024-01-03 ENCOUNTER — Encounter (HOSPITAL_BASED_OUTPATIENT_CLINIC_OR_DEPARTMENT_OTHER): Payer: Self-pay

## 2024-01-03 DIAGNOSIS — E860 Dehydration: Secondary | ICD-10-CM | POA: Diagnosis not present

## 2024-01-03 DIAGNOSIS — R0981 Nasal congestion: Secondary | ICD-10-CM | POA: Diagnosis present

## 2024-01-03 DIAGNOSIS — U071 COVID-19: Secondary | ICD-10-CM

## 2024-01-03 DIAGNOSIS — J4541 Moderate persistent asthma with (acute) exacerbation: Secondary | ICD-10-CM

## 2024-01-03 DIAGNOSIS — J45901 Unspecified asthma with (acute) exacerbation: Secondary | ICD-10-CM | POA: Diagnosis not present

## 2024-01-03 LAB — RESP PANEL BY RT-PCR (RSV, FLU A&B, COVID)  RVPGX2
Influenza A by PCR: NEGATIVE
Influenza B by PCR: NEGATIVE
Resp Syncytial Virus by PCR: NEGATIVE
SARS Coronavirus 2 by RT PCR: POSITIVE — AB

## 2024-01-03 MED ORDER — ALBUTEROL SULFATE (2.5 MG/3ML) 0.083% IN NEBU
5.0000 mg | INHALATION_SOLUTION | Freq: Once | RESPIRATORY_TRACT | Status: AC
  Administered 2024-01-03: 5 mg via RESPIRATORY_TRACT
  Filled 2024-01-03: qty 6

## 2024-01-03 MED ORDER — LACTATED RINGERS IV BOLUS
1000.0000 mL | Freq: Once | INTRAVENOUS | Status: AC
  Administered 2024-01-03: 1000 mL via INTRAVENOUS

## 2024-01-03 MED ORDER — ONDANSETRON HCL 4 MG/2ML IJ SOLN
4.0000 mg | Freq: Once | INTRAMUSCULAR | Status: AC
  Administered 2024-01-03: 4 mg via INTRAVENOUS
  Filled 2024-01-03: qty 2

## 2024-01-03 MED ORDER — IPRATROPIUM BROMIDE 0.02 % IN SOLN
0.5000 mg | Freq: Once | RESPIRATORY_TRACT | Status: AC
  Administered 2024-01-03: 0.5 mg via RESPIRATORY_TRACT
  Filled 2024-01-03: qty 2.5

## 2024-01-03 NOTE — Discharge Instructions (Addendum)
 No signs of pneumonia today.  EKG showed no signs of heart damage and your oxygen level has been normal.  You have covid and should get better in the next 3-5 days.  Take tylenol as needed for fever and aches/pains.  Continue the prednisone and use your inhaler as needed.

## 2024-01-03 NOTE — ED Triage Notes (Signed)
 Pt reports c/o fever, SOB, rapid HR, productive cough, x4 days ago. Pt reports partner has been ill as well but not tested. Pt went to UC and tested for flu, but was negative.

## 2024-01-03 NOTE — ED Provider Notes (Signed)
 [place] EMERGENCY DEPARTMENT AT [facility] Provider Note   CSN: [id number] Arrival date & time: 01/03/24  1250     History  Chief Complaint  Patient presents with   Fever    Casey Richmond is a 28 y.o. female.  Patient is a 28 year old female with a history of PCOS, asthma, allergies, obesity who is presenting today with a 4 to 5-day history of progressive URI symptoms including nasal congestion, cough sometimes productive of sputum, low-grade temperatures of 99, vomiting, nausea, poor oral intake.  She has had progressive shortness of breath and reports went to urgent care yesterday and at that time was given an inhaler, prednisone, promethazine cough medicine and a nasal spray.  However she reports she started vomiting again today and feels worse.  She is also felt like her heart has been racing today.  No diarrhea.  Patient's family who is present in the room with her reports they were sick first and she developed symptoms after them.  She was tested for flu yesterday which was negative.  The history is provided by the patient.  Fever      Home Medications Prior to Admission medications   Medication Sig Start Date End Date Taking? Authorizing Provider  norethindrone-ethinyl estradiol-iron (JUNEL FE 1.5/30) 1.5-30 MG-MCG tablet Take 1 tablet by mouth daily. 04/16/23   Zelma Hidden, FNP      Allergies    Ethinyl estradiol and Norethindrone    Review of Systems   Review of Systems  Constitutional:  Positive for fever.    Physical Exam Updated Vital Signs BP (!) 173/88 (BP Location: Right Arm)   Pulse (!) 118   Temp 98.5 F (36.9 C) (Oral)   Resp (!) 26   Ht 5\' 3"  (1.6 m)   Wt (!) 143.3 kg   SpO2 95%   BMI 55.98 kg/m  Physical Exam Vitals and nursing note reviewed.  Constitutional:      General: She is not in acute distress.    Appearance: She is well-developed.  HENT:     Head: Normocephalic and atraumatic.     Right Ear: Tympanic  membrane normal.     Left Ear: Tympanic membrane normal.     Nose: Congestion present.     Mouth/Throat:     Mouth: Mucous membranes are moist.  Eyes:     Pupils: Pupils are equal, round, and reactive to light.  Cardiovascular:     Rate and Rhythm: Regular rhythm. Tachycardia present.     Heart sounds: Normal heart sounds. No murmur heard.    No friction rub.  Pulmonary:     Effort: Pulmonary effort is normal.     Breath sounds: Decreased breath sounds present. No wheezing or rales.  Abdominal:     General: Bowel sounds are normal. There is no distension.     Palpations: Abdomen is soft.     Tenderness: There is no abdominal tenderness. There is no guarding or rebound.  Musculoskeletal:        General: No tenderness. Normal range of motion.     Right lower leg: No edema.     Left lower leg: No edema.     Comments: No edema  Skin:    General: Skin is warm and dry.     Findings: No rash.  Neurological:     Mental Status: She is alert and oriented to person, place, and time.     Cranial Nerves: No cranial nerve deficit.  Psychiatric:  Behavior: Behavior normal.     ED Results / Procedures / Treatments   Labs (all labs ordered are listed, but only abnormal results are displayed) Labs Reviewed  RESP PANEL BY RT-PCR (RSV, FLU A&B, COVID)  [id number] - Abnormal; Notable for the following components:      Result Value   SARS Coronavirus 2 by RT PCR POSITIVE (*)    All other components within normal limits    EKG EKG Interpretation Date/Time:  Sunday January 03 2024 13:17:20 EDT Ventricular Rate:  109 PR Interval:  174 QRS Duration:  83 QT Interval:  322 QTC Calculation: 434 R Axis:   27  Text Interpretation: Sinus tachycardia No previous tracing Confirmed by Almond Army ([id number]) on 01/03/2024 1:53:41 PM  Radiology DG Chest 2 View Result Date: 01/03/2024 CLINICAL DATA:  Cough EXAM: CHEST - 2 VIEW COMPARISON:  None Available. FINDINGS: Some detail limited by  patient body habitus. A linear density is present in the left midlung zone. There is mild prominence of the central interstitium. No pleural effusion or pneumothorax. IMPRESSION: 1. Left mid lung zone atelectasis. 2. Mild interstitial prominence which may be secondary to inflammatory or edema etiologies. Electronically Signed   By: Reagan Camera M.D.   On: 01/03/2024 13:28    Procedures Procedures    Medications Ordered in ED Medications  albuterol (PROVENTIL) (2.5 MG/3ML) 0.083% nebulizer solution 5 mg (has no administration in time range)  ipratropium (ATROVENT) nebulizer solution 0.5 mg (has no administration in time range)  lactated ringers bolus 1,000 mL (1,000 mLs Intravenous New Bag/Given 01/03/24 1332)  ondansetron (ZOFRAN) injection 4 mg (4 mg Intravenous Given 01/03/24 1329)    ED Course/ Medical Decision Making/ A&P                                 Medical Decision Making Amount and/or Complexity of Data Reviewed Labs: ordered. Decision-making details documented in ED Course. Radiology: ordered and independent interpretation performed. Decision-making details documented in ED Course. ECG/medicine tests: ordered and independent interpretation performed. Decision-making details documented in ED Course.  Risk Prescription drug management.   Pt with multiple medical problems and comorbidities and presenting today with a complaint that caries a high risk for morbidity and mortality.  Here today with URI symptoms and progressive shortness of breath.  Patient is satting 95 to 97% on room air but is tachypneic.  Patient is tachycardic up to 130 with any activity but was sitting in the bed improves to 115.  Negative for flu yesterday and has started prednisone and been using albuterol.  Suspect most likely viral URI with asthma exacerbation.  Will rule out pneumonia or dysrhythmia.  Also concern for possible dehydration as patient has had poor oral intake.  2:27 PM I have independently  visualized and interpreted pt's images today.  CXR without pneumonia.  Radiology reported left mid lung zone atelectasis and mild interstitial prominence could be related to inflammatory changes.  I independently interpreted patient's EKG which shows a sinus tachycardia but no other acute findings and patient is COVID-positive.  On repeat evaluation after receiving fluid patient's heart rate is now in the 90s.  She is still feeling some shortness of breath and will do albuterol and Atrovent treatment.  Did discuss the findings with her.  Recommended she continue on the prednisone that she started yesterday.  Also using the inhaler as needed.  Will avoid Paxlovid at this time.  Final Clinical Impression(s) / ED Diagnoses Final diagnoses:  COVID  Dehydration  Moderate persistent asthma with exacerbation    Rx / DC Orders ED Discharge Orders     None         Almond Army, MD 01/03/24 1427

## 2024-05-04 ENCOUNTER — Other Ambulatory Visit: Payer: Self-pay | Admitting: Obstetrics and Gynecology

## 2024-05-04 DIAGNOSIS — Z01419 Encounter for gynecological examination (general) (routine) without abnormal findings: Secondary | ICD-10-CM

## 2024-05-04 DIAGNOSIS — E282 Polycystic ovarian syndrome: Secondary | ICD-10-CM
